# Patient Record
Sex: Female | Born: 1980 | Race: White | Hispanic: No | Marital: Single | Smoking: Former smoker
Health system: Southern US, Community
[De-identification: ages and names within clinical notes are randomized; demographics above are authoritative.]

## PROBLEM LIST (undated history)

## (undated) DIAGNOSIS — F32A Depression, unspecified: Secondary | ICD-10-CM

## (undated) DIAGNOSIS — F329 Major depressive disorder, single episode, unspecified: Secondary | ICD-10-CM

## (undated) HISTORY — DX: Major depressive disorder, single episode, unspecified: F32.9

## (undated) HISTORY — DX: Depression, unspecified: F32.A

## (undated) HISTORY — PX: WISDOM TOOTH EXTRACTION: SHX21

---

## 2012-10-27 ENCOUNTER — Ambulatory Visit: Payer: Self-pay | Admitting: Internal Medicine

## 2013-09-08 ENCOUNTER — Encounter (INDEPENDENT_AMBULATORY_CARE_PROVIDER_SITE_OTHER): Payer: Self-pay

## 2013-09-08 ENCOUNTER — Encounter: Payer: Self-pay | Admitting: Internal Medicine

## 2013-09-08 ENCOUNTER — Ambulatory Visit (INDEPENDENT_AMBULATORY_CARE_PROVIDER_SITE_OTHER): Payer: BC Managed Care – PPO | Admitting: Internal Medicine

## 2013-09-08 VITALS — BP 106/74 | HR 68 | Temp 98.5°F | Ht 65.5 in | Wt 160.0 lb

## 2013-09-08 DIAGNOSIS — M542 Cervicalgia: Secondary | ICD-10-CM

## 2013-09-08 NOTE — Progress Notes (Signed)
Subjective:    Patient ID: Kristina Hancock, female    DOB: 21-May-1980, 33 y.o.   MRN: [id number]  HPI  Pt presents to the clinic today with c/o soreness in the front of her neck. This started 4 days shortly after she was cleaning a room at work. She also noticed a sharp pain in the back of her neck radiating to the base of her skull. The sharp pain has resolved but she now reports a soreness in the area. She denies ear pain, runny nose, sore throat, difficulty swallowing. She denies any particular injury to the area. She has not tried anything OTC for this. She reports that her symptoms are improved today but she is just concerned about what made her feel this way.   Review of Systems      No past medical history on file.  Current Outpatient Prescriptions  Medication Sig Dispense Refill  . escitalopram (LEXAPRO) 20 MG tablet Take 20 mg by mouth daily.       No current facility-administered medications for this visit.    Allergies  Allergen Reactions  . Hydrocodone Itching    No family history on file.  History   Social History  . Marital Status: Single    Spouse Name: N/A    Number of Children: N/A  . Years of Education: N/A   Occupational History  . Not on file.   Social History Main Topics  . Smoking status: Former Games developer  . Smokeless tobacco: Not on file     Comment: quit 12 years ago  . Alcohol Use: Yes     Comment: rare  . Drug Use: No  . Sexual Activity: Not on file   Other Topics Concern  . Not on file   Social History Narrative  . No narrative on file     Constitutional: Denies fever, malaise, fatigue, headache or abrupt weight changes.  HEENT: Denies eye pain, eye redness, ear pain, ringing in the ears, wax buildup, runny nose, nasal congestion, bloody nose, or sore throat. Respiratory: Denies difficulty breathing, shortness of breath, cough or sputum production.   Cardiovascular: Denies chest pain, chest tightness, palpitations or swelling in the  hands or feet.  Musculoskeletal: Pt reports neck pain. Denies decrease in range of motion, difficulty with gait, or joint pain and swelling.  Skin: Denies redness, rashes, lesions or ulcercations.    No other specific complaints in a complete review of systems (except as listed in HPI above).  Objective:   Physical Exam   BP 106/74  Pulse 68  Temp(Src) 98.5 F (36.9 C) (Oral)  Ht 5' 5.5" (1.664 m)  Wt 160 lb (72.576 kg)  BMI 26.21 kg/m2  SpO2 98%  LMP 08/28/2013 Wt Readings from Last 3 Encounters:  09/08/13 160 lb (72.576 kg)    General: Appears her stated age, well developed, well nourished in NAD. Skin: Warm, dry and intact. No rashes, lesions or ulcerations noted. HEENT: Head: normal shape and size; Eyes: sclera white, no icterus, conjunctiva pink, PERRLA and EOMs intact; Ears: Tm's gray and intact, normal light reflex; Nose: mucosa pink and moist, septum midline; Throat/Mouth: Teeth present, mucosa pink and moist, no exudate, lesions or ulcerations noted.  Neck: Normal range of motion. Neck supple, trachea midline. No masses, lumps or thyromegaly present.  Cardiovascular: Normal rate and rhythm. S1,S2 noted.  No murmur, rubs or gallops noted. No JVD or BLE edema. No carotid bruits noted. Pulmonary/Chest: Normal effort and positive vesicular breath sounds. No respiratory distress.  No wheezes, rales or ronchi noted.        Assessment & Plan:   Neck soreness:  She would like to have her thyroid checked Will check CBC as well Exam normal today Advised her to try some Ibuprofen in case of possible inflammation  Will follow up with you after labs are back

## 2013-09-08 NOTE — Progress Notes (Signed)
Pre visit review using our clinic review tool, if applicable. No additional management support is needed unless otherwise documented below in the visit note. 

## 2013-09-09 LAB — CBC
HEMATOCRIT: 41.7 % (ref 36.0–46.0)
Hemoglobin: 14.1 g/dL (ref 12.0–15.0)
MCHC: 33.9 g/dL (ref 30.0–36.0)
MCV: 88.7 fl (ref 78.0–100.0)
PLATELETS: 303 10*3/uL (ref 150.0–400.0)
RBC: 4.7 Mil/uL (ref 3.87–5.11)
RDW: 12.9 % (ref 11.5–15.5)
WBC: 7 10*3/uL (ref 4.0–10.5)

## 2013-09-09 LAB — TSH: TSH: 0.8 u[IU]/mL (ref 0.35–4.50)

## 2013-09-09 LAB — T4, FREE: Free T4: 1 ng/dL (ref 0.60–1.60)

## 2013-10-07 ENCOUNTER — Ambulatory Visit: Payer: Self-pay | Admitting: Family Medicine

## 2013-11-25 ENCOUNTER — Encounter: Payer: Self-pay | Admitting: Internal Medicine

## 2013-11-25 ENCOUNTER — Ambulatory Visit (INDEPENDENT_AMBULATORY_CARE_PROVIDER_SITE_OTHER): Payer: BC Managed Care – PPO | Admitting: Internal Medicine

## 2013-11-25 VITALS — BP 104/78 | HR 67 | Temp 98.9°F | Ht 65.5 in | Wt 163.0 lb

## 2013-11-25 DIAGNOSIS — Z13220 Encounter for screening for lipoid disorders: Secondary | ICD-10-CM

## 2013-11-25 DIAGNOSIS — F32A Depression, unspecified: Secondary | ICD-10-CM | POA: Insufficient documentation

## 2013-11-25 DIAGNOSIS — F329 Major depressive disorder, single episode, unspecified: Secondary | ICD-10-CM

## 2013-11-25 DIAGNOSIS — L709 Acne, unspecified: Secondary | ICD-10-CM | POA: Insufficient documentation

## 2013-11-25 MED ORDER — CLINDAMYCIN PHOS-BENZOYL PEROX 1-5 % EX GEL: Freq: Two times a day (BID) | CUTANEOUS | Status: DC

## 2013-11-25 NOTE — Progress Notes (Signed)
Pre visit review using our clinic review tool, if applicable. No additional management support is needed unless otherwise documented below in the visit note. 

## 2013-11-25 NOTE — Patient Instructions (Signed)

## 2013-11-25 NOTE — Assessment & Plan Note (Signed)
Well controlled on lexapro Will check CMET today

## 2013-11-25 NOTE — Progress Notes (Signed)
HPI  Pt presents to the clinic today to establish care. She has not had a PCP in a few years. She does have some concerns today about acne or her fast. She has tried OTC cleanses without relief. She would like a topical prescription to apply.  Anxiety: Controlled on Lexapro.  Flu: never Tetanus: 2014 LMP: 10/26/13 Pap Smear: 11/2012 Dentist: biannually  Past Medical History  Diagnosis Date  . Depression     Current Outpatient Prescriptions  Medication Sig Dispense Refill  . amphetamine-dextroamphetamine (ADDERALL) 20 MG tablet as needed.   0  . escitalopram (LEXAPRO) 20 MG tablet Take 20 mg by mouth daily.     No current facility-administered medications for this visit.    Allergies  Allergen Reactions  . Hydrocodone Itching    Family History  Problem Relation Age of Onset  . Hypertension Brother   . Stroke Maternal Uncle   . Diabetes Maternal Uncle   . Cancer Maternal Grandmother     ovarin  . Heart disease Paternal Grandfather     History   Social History  . Marital Status: Single    Spouse Name: N/A    Number of Children: N/A  . Years of Education: N/A   Occupational History  . Not on file.   Social History Main Topics  . Smoking status: Former Games developermoker  . Smokeless tobacco: Not on file     Comment: quit 12 years ago  . Alcohol Use: Yes     Comment: rare  . Drug Use: No  . Sexual Activity: No   Other Topics Concern  . Not on file   Social History Narrative    ROS:  Constitutional: Denies fever, malaise, fatigue, headache or abrupt weight changes.  Respiratory: Denies difficulty breathing, shortness of breath, cough or sputum production.   Cardiovascular: Denies chest pain, chest tightness, palpitations or swelling in the hands or feet.  Skin: Denies lesions, ulceration or rash. Psych: Pt reports anxiety/depression. Denies SI/HI.  No other specific complaints in a complete review of systems (except as listed in HPI above).  PE:  BP 104/78  mmHg  Pulse 67  Temp(Src) 98.9 F (37.2 C) (Oral)  Ht 5' 5.5" (1.664 m)  Wt 163 lb (73.936 kg)  BMI 26.70 kg/m2  SpO2 99%  LMP 10/26/2013 Wt Readings from Last 3 Encounters:  11/25/13 163 lb (73.936 kg)  09/08/13 160 lb (72.576 kg)    General: Appears her stated age, well developed, well nourished in NAD. Skin: Cystic acne noted on chin. Skin otherwise, dry and intact. Cardiovascular: Normal rate and rhythm. S1,S2 noted.  No murmur, rubs or gallops noted. Pulmonary/Chest: Normal effort and positive vesicular breath sounds. No respiratory distress. No wheezes, rales or ronchi noted.  Psychiatric: Mood and affect normal. Behavior is normal. Judgment and thought content normal.    CBC    Component Value Date/Time   WBC 7.0 09/08/2013 1633   RBC 4.70 09/08/2013 1633   HGB 14.1 09/08/2013 1633   HCT 41.7 09/08/2013 1633   PLT 303.0 09/08/2013 1633   MCV 88.7 09/08/2013 1633   MCHC 33.9 09/08/2013 1633   RDW 12.9 09/08/2013 1633     Assessment and Plan: Acne:  RX for benzaclin given  Screening for hyperlipidemia:  Will check Lipid profile today  RTC in 1 year for annual physical

## 2013-11-26 LAB — LIPID PANEL
CHOLESTEROL: 175 mg/dL (ref 0–200)
HDL: 63 mg/dL (ref >39)
LDL Cholesterol: 97 mg/dL (ref 0–99)
TRIGLYCERIDES: 73 mg/dL (ref <150)
Total CHOL/HDL Ratio: 2.8 Ratio
VLDL: 15 mg/dL (ref 0–40)

## 2013-11-26 LAB — COMPREHENSIVE METABOLIC PANEL
ALK PHOS: 74 U/L (ref 39–117)
ALT: 16 U/L (ref 0–35)
AST: 9 U/L (ref 0–37)
Albumin: 4.1 g/dL (ref 3.5–5.2)
BILIRUBIN TOTAL: 0.5 mg/dL (ref 0.2–1.2)
BUN: 7 mg/dL (ref 6–23)
CO2: 26 meq/L (ref 19–32)
CREATININE: 0.68 mg/dL (ref 0.50–1.10)
Calcium: 9.1 mg/dL (ref 8.4–10.5)
Chloride: 104 mEq/L (ref 96–112)
GLUCOSE: 66 mg/dL — AB (ref 70–99)
Potassium: 4.3 mEq/L (ref 3.5–5.3)
SODIUM: 140 meq/L (ref 135–145)
TOTAL PROTEIN: 6.6 g/dL (ref 6.0–8.3)

## 2014-02-08 ENCOUNTER — Encounter: Payer: Self-pay | Admitting: Internal Medicine

## 2014-02-24 ENCOUNTER — Encounter: Payer: Self-pay | Admitting: Internal Medicine

## 2014-02-24 MED ORDER — ESCITALOPRAM OXALATE 20 MG PO TABS: 20.0000 mg | ORAL_TABLET | Freq: Every day | ORAL | Status: DC

## 2014-02-27 ENCOUNTER — Ambulatory Visit (INDEPENDENT_AMBULATORY_CARE_PROVIDER_SITE_OTHER): Payer: BLUE CROSS/BLUE SHIELD | Admitting: Internal Medicine

## 2014-02-27 ENCOUNTER — Encounter: Payer: Self-pay | Admitting: Internal Medicine

## 2014-02-27 VITALS — BP 112/74 | HR 67 | Temp 98.1°F | Wt 172.0 lb

## 2014-02-27 DIAGNOSIS — L200 Besnier's prurigo: Secondary | ICD-10-CM

## 2014-02-27 DIAGNOSIS — L239 Allergic contact dermatitis, unspecified cause: Secondary | ICD-10-CM

## 2014-02-27 MED ORDER — PREDNISONE 10 MG PO TABS: ORAL_TABLET | ORAL | Status: DC

## 2014-02-27 NOTE — Progress Notes (Signed)
Subjective:    Patient ID: Kristina Hancock, female    DOB: March 17, 1980, 34 y.o.   MRN: [id number]  HPI  Pt presents to the clinic today with c/o a rash on her neck. She reports this started 2 days ago. The rash is itchy and sore. It does seem to be spreading around the sides of her neck. She has has not used any new soaps, lotions, or detergents. She has used Calamine lotion OTC without any relief. She reports she has not changed her diet or started any new medications. She has never had a rash like this in the past.  Review of Systems      Past Medical History  Diagnosis Date  . Depression     Current Outpatient Prescriptions  Medication Sig Dispense Refill  . amphetamine-dextroamphetamine (ADDERALL) 20 MG tablet as needed.   0  . clindamycin-benzoyl peroxide (BENZACLIN) gel Apply topically 2 (two) times daily. 25 g 0  . escitalopram (LEXAPRO) 20 MG tablet Take 1 tablet (20 mg total) by mouth daily. 30 tablet 4   No current facility-administered medications for this visit.    Allergies  Allergen Reactions  . Hydrocodone Itching    Family History  Problem Relation Age of Onset  . Hypertension Brother   . Stroke Maternal Uncle   . Diabetes Maternal Uncle   . Cancer Maternal Grandmother     ovarin  . Heart disease Paternal Grandfather     History   Social History  . Marital Status: Single    Spouse Name: N/A    Number of Children: N/A  . Years of Education: N/A   Occupational History  . Not on file.   Social History Main Topics  . Smoking status: Former Games developer  . Smokeless tobacco: Not on file     Comment: quit 12 years ago  . Alcohol Use: Yes     Comment: rare  . Drug Use: No  . Sexual Activity: No   Other Topics Concern  . Not on file   Social History Narrative     Constitutional: Denies fever, malaise, fatigue, headache or abrupt weight changes.  Respiratory: Denies difficulty breathing, shortness of breath, cough or sputum production.     Cardiovascular: Denies chest pain, chest tightness, palpitations or swelling in the hands or feet.  Skin: Pt reports rash. Denies redness, lesions or ulcercations.  Neurological: Denies dizziness, difficulty with memory, difficulty with speech or problems with balance and coordination.   No other specific complaints in a complete review of systems (except as listed in HPI above).  Objective:   Physical Exam   BP 112/74 mmHg  Pulse 67  Temp(Src) 98.1 F (36.7 C) (Oral)  Wt 172 lb (78.019 kg)  SpO2 98% Wt Readings from Last 3 Encounters:  02/27/14 172 lb (78.019 kg)  11/25/13 163 lb (73.936 kg)  09/08/13 160 lb (72.576 kg)    General: Appears her stated age, well developed, well nourished in NAD. Skin: Warm, dry and intact. Maculopapular rash noted on neck only. Cardiovascular: Normal rate and rhythm. S1,S2 noted.  No murmur, rubs or gallops noted.  Pulmonary/Chest: Normal effort and positive vesicular breath sounds. No respiratory distress. No wheezes, rales or ronchi noted.  Neurological: Alert and oriented.   BMET    Component Value Date/Time   NA 140 11/25/2013 1552   K 4.3 11/25/2013 1552   CL 104 11/25/2013 1552   CO2 26 11/25/2013 1552   GLUCOSE 66* 11/25/2013 1552   BUN 7 11/25/2013  1552   CREATININE 0.68 11/25/2013 1552   CALCIUM 9.1 11/25/2013 1552    Lipid Panel     Component Value Date/Time   CHOL 175 11/25/2013 1552   TRIG 73 11/25/2013 1552   HDL 63 11/25/2013 1552   CHOLHDL 2.8 11/25/2013 1552   VLDL 15 11/25/2013 1552   LDLCALC 97 11/25/2013 1552    CBC    Component Value Date/Time   WBC 7.0 09/08/2013 1633   RBC 4.70 09/08/2013 1633   HGB 14.1 09/08/2013 1633   HCT 41.7 09/08/2013 1633   PLT 303.0 09/08/2013 1633   MCV 88.7 09/08/2013 1633   MCHC 33.9 09/08/2013 1633   RDW 12.9 09/08/2013 1633    Hgb A1C No results found for: HGBA1C      Assessment & Plan:   Allergic Dermatitis noted on neck:  The area is too large to be  treated with steroid cream eRx for pred taper x 6 days Try to avoid touching the neck if you can  RTC as needed or if symptoms persist or worsen

## 2014-02-27 NOTE — Progress Notes (Signed)
Pre visit review using our clinic review tool, if applicable. No additional management support is needed unless otherwise documented below in the visit note. 

## 2014-02-27 NOTE — Patient Instructions (Signed)

## 2014-03-02 ENCOUNTER — Encounter: Payer: Self-pay | Admitting: Internal Medicine

## 2014-03-06 NOTE — Telephone Encounter (Signed)
Work note has been faxed per pt request

## 2014-03-14 ENCOUNTER — Encounter: Payer: Self-pay | Admitting: Internal Medicine

## 2014-03-15 MED ORDER — AMPHETAMINE-DEXTROAMPHETAMINE 20 MG PO TABS: 20.0000 mg | ORAL_TABLET | Freq: Two times a day (BID) | ORAL | Status: DC

## 2014-03-15 NOTE — Telephone Encounter (Signed)
RX printed and signed, placed in MYD's box

## 2014-03-16 NOTE — Telephone Encounter (Signed)
Rx left in front office for pick up and pt is aware  

## 2014-05-01 ENCOUNTER — Telehealth: Payer: Self-pay | Admitting: Internal Medicine

## 2014-05-02 MED ORDER — AMPHETAMINE-DEXTROAMPHETAMINE 20 MG PO TABS: 20.0000 mg | ORAL_TABLET | Freq: Two times a day (BID) | ORAL | Status: DC

## 2014-05-02 NOTE — Telephone Encounter (Signed)
Adderall RX printed and signed and placed in [facility] box

## 2014-07-27 ENCOUNTER — Other Ambulatory Visit: Payer: Self-pay | Admitting: Internal Medicine

## 2014-07-27 MED ORDER — AMPHETAMINE-DEXTROAMPHETAMINE 20 MG PO TABS: 20.0000 mg | ORAL_TABLET | Freq: Two times a day (BID) | ORAL | Status: DC

## 2014-07-27 MED ORDER — ESCITALOPRAM OXALATE 20 MG PO TABS: 20.0000 mg | ORAL_TABLET | Freq: Every day | ORAL | Status: DC

## 2014-07-27 NOTE — Telephone Encounter (Signed)
Last filled 05/02/2014 Adderall---Celexa last filled 02/24/14 with 4 refills--pt request 90 day supply--please advise

## 2014-07-27 NOTE — Telephone Encounter (Signed)
mychart msg sent to pt 

## 2014-07-27 NOTE — Telephone Encounter (Signed)
lexapro sent electronically. Adderall RX printed and signed and placed in MYD box

## 2014-09-07 ENCOUNTER — Ambulatory Visit (INDEPENDENT_AMBULATORY_CARE_PROVIDER_SITE_OTHER): Payer: BLUE CROSS/BLUE SHIELD | Admitting: Internal Medicine

## 2014-09-07 ENCOUNTER — Encounter: Payer: Self-pay | Admitting: Internal Medicine

## 2014-09-07 VITALS — BP 110/70 | HR 92 | Temp 97.7°F | Wt 161.0 lb

## 2014-09-07 DIAGNOSIS — K1120 Sialoadenitis, unspecified: Secondary | ICD-10-CM

## 2014-09-07 MED ORDER — AMOXICILLIN 500 MG PO TABS: 1000.0000 mg | ORAL_TABLET | Freq: Two times a day (BID) | ORAL | Status: DC

## 2014-09-07 MED ORDER — NEOMYCIN-POLYMYXIN-HC 3.5-10000-1 OT SUSP: 3.0000 [drp] | Freq: Four times a day (QID) | OTIC | Status: DC

## 2014-09-07 NOTE — Progress Notes (Signed)
   Subjective:    Patient ID: Kristina Hancock, female    DOB: [birth year], 34 y.o.   MRN: [id number]  HPI Here due to ear pain  Having soreness in left ear starting last night Progressively worsening Towards the outside ?nodes swollen  2 days ago--she was stuffy and eye itching Thought it was allergy Didn't persist (but doesn't feel great in the past few hours) No fever  No discharge No Rx No recent swimming  Current Outpatient Prescriptions on File Prior to Visit  Medication Sig Dispense Refill  . amphetamine-dextroamphetamine (ADDERALL) 20 MG tablet Take 1 tablet (20 mg total) by mouth 2 (two) times daily. 60 tablet 0  . escitalopram (LEXAPRO) 20 MG tablet Take 1 tablet (20 mg total) by mouth daily. 90 tablet 1   No current facility-administered medications on file prior to visit.    Allergies  Allergen Reactions  . Hydrocodone Itching    Past Medical History  Diagnosis Date  . Depression     Past Surgical History  Procedure Laterality Date  . Wisdom tooth extraction      Family History  Problem Relation Age of Onset  . Hypertension Brother   . Stroke Maternal Uncle   . Diabetes Maternal Uncle   . Cancer Maternal Grandmother     ovarin  . Heart disease Paternal Grandfather     Social History   Social History  . Marital Status: Single    Spouse Name: N/A  . Number of Children: N/A  . Years of Education: N/A   Occupational History  . Not on file.   Social History Main Topics  . Smoking status: Former Games developer  . Smokeless tobacco: Not on file     Comment: quit 12 years ago  . Alcohol Use: Yes     Comment: rare  . Drug Use: No  . Sexual Activity: No   Other Topics Concern  . Not on file   Social History Narrative   Review of Systems  No swallowing problems Appetite is fine     Objective:   Physical Exam  Constitutional: She appears well-developed and well-nourished. No distress.  HENT:  Mouth/Throat: Oropharynx is clear and moist. No  oropharyngeal exudate.  Slight enlargement and tenderness of right parotid. Some tragal tenderness on right but exam of ear is normal Left side normal No sinus tenderness          Assessment & Plan:

## 2014-09-07 NOTE — Progress Notes (Signed)
Pre visit review using our clinic review tool, if applicable. No additional management support is needed unless otherwise documented below in the visit note. 

## 2014-09-07 NOTE — Assessment & Plan Note (Signed)
I think this is causing her ear pain--since ear looks okay Will rx with amoxil Will give Rx for cortisporin just in case (going on cruise tomorrow)

## 2014-10-18 ENCOUNTER — Telehealth: Payer: Self-pay | Admitting: Family

## 2014-10-18 DIAGNOSIS — B358 Other dermatophytoses: Secondary | ICD-10-CM

## 2014-10-18 MED ORDER — CLOTRIMAZOLE-BETAMETHASONE 1-0.05 % EX CREA: 1.0000 "application " | TOPICAL_CREAM | Freq: Two times a day (BID) | CUTANEOUS | Status: DC

## 2014-10-18 NOTE — Progress Notes (Signed)
We are sorry that you are not feeling well.  Here is how we plan to help!  Overview:  Based on our telephone conversation, the patchy/itchy area beside your right eyelid could be viral or fungal given the history you shared with me on the phone about a recent fungal skin patch/infection for which you were treated. That being said, we can take a 2-pronged approach as I mentioned on the telephone to include a cream that has an anti-fungal and a mild steroid in the same tube. It is unlikely to be contact dermatitis based on your denial of any recent changes in soaps, face wash, lotions, or makeup.   Another specific option (hard to tell since your image would not upload to our system), is that it is a fungal infection commonly known as Facial Ringworm. It nearly fits the description for that. I will paste that information below as well to help you. The treatment is the same, regardless.  Plan:  Clotrimazole-betamethasone cream 1 application twice to affected areas, applied 2cm beyond the border of the patchy areas.   Details:  Facial ringworm may occur in people of all ages, of all races, and of both sexes. However, it is more common in warmer, more humid climates. In addition, it is most frequently seen in adults aged 20-40.  People with suppressed immune systems (eg, with diabetes, leukemia, or HIV/AIDS) are more likely to develop facial ringworm or to have more severe forms of the disease.  Signs and Symptoms The most common locations for facial ringworm include the following: Cheeks Nose Around the eye Chin Forehead  Facial ringworm appears as one or more pink-to-red scaly patches ranging in size from 1 to 5 cm. The border of the affected skin may be raised and may contain bumps, blisters, or scabs. Often, the center of the lesion has normal-appearing skin with a ring-shaped edge, leading to the nickname "ringworm," even though it is not caused by a worm.  Facial ringworm can be itchy, and  it may get worse or feel sunburned after exposure to the sun.  Self-Care Guidelines:  Apply the cream to each lesion and to the normal-appearing skin 2 cm beyond the border of the affected skin for at least 2 weeks until the lesions are completely gone. Because ringworm is very contagious, avoid contact sports until lesions have been treated for at least 48 hours.  Since people often have tinea infections on more than one body part, examine yourself for other ringworm infections, such as in the groin (tinea cruris), on the feet (tinea pedis, athlete's foot), and anywhere else on the body (tinea corporis).  Have any household pets evaluated by a veterinarian to make sure that they do not have a fungal (ie, dermatophyte) infection. If the veterinarian discovers an infection, be sure to have the animal treated.  Your e-visit answers were reviewed by a board certified advanced clinical practitioner to complete your personal care plan.  Depending on the condition, your plan could have included both over the counter or prescription medications.  If there is a problem please reply  once you have received a response from your provider.  Your safety is important to [place].  If you have drug allergies check your prescription carefully.    You can use MyChart to ask questions about today's visit, request a non-urgent call back, or ask for a work or school excuse for 24 hours related to this e-Visit. If it has been greater than 24 hours you will need  to follow up with your provider, or enter a new e-Visit to address those concerns.   You will get an e-mail in the next two days asking about your experience.  I hope that your e-visit has been valuable and will speed your recovery. Thank you for using e-visits.

## 2014-11-03 ENCOUNTER — Encounter: Payer: Self-pay | Admitting: Internal Medicine

## 2015-02-23 ENCOUNTER — Ambulatory Visit (INDEPENDENT_AMBULATORY_CARE_PROVIDER_SITE_OTHER): Payer: BLUE CROSS/BLUE SHIELD | Admitting: Internal Medicine

## 2015-02-23 ENCOUNTER — Encounter: Payer: Self-pay | Admitting: Internal Medicine

## 2015-02-23 ENCOUNTER — Other Ambulatory Visit (HOSPITAL_COMMUNITY)
Admission: RE | Admit: 2015-02-23 | Discharge: 2015-02-23 | Disposition: A | Discharge status: Home / Self Care | Payer: BLUE CROSS/BLUE SHIELD | Source: Ambulatory Visit | Attending: Internal Medicine | Admitting: Internal Medicine

## 2015-02-23 VITALS — BP 118/78 | HR 73 | Temp 98.2°F | Ht 63.5 in | Wt 174.0 lb

## 2015-02-23 DIAGNOSIS — Z1151 Encounter for screening for human papillomavirus (HPV): Secondary | ICD-10-CM | POA: Insufficient documentation

## 2015-02-23 DIAGNOSIS — J0100 Acute maxillary sinusitis, unspecified: Secondary | ICD-10-CM | POA: Diagnosis not present

## 2015-02-23 DIAGNOSIS — Z124 Encounter for screening for malignant neoplasm of cervix: Secondary | ICD-10-CM

## 2015-02-23 DIAGNOSIS — Z01419 Encounter for gynecological examination (general) (routine) without abnormal findings: Secondary | ICD-10-CM | POA: Insufficient documentation

## 2015-02-23 DIAGNOSIS — Z0001 Encounter for general adult medical examination with abnormal findings: Secondary | ICD-10-CM | POA: Diagnosis not present

## 2015-02-23 DIAGNOSIS — Z0000 Encounter for general adult medical examination without abnormal findings: Secondary | ICD-10-CM

## 2015-02-23 DIAGNOSIS — F329 Major depressive disorder, single episode, unspecified: Secondary | ICD-10-CM

## 2015-02-23 DIAGNOSIS — F32A Depression, unspecified: Secondary | ICD-10-CM

## 2015-02-23 LAB — LIPID PANEL
CHOL/HDL RATIO: 3
CHOLESTEROL: 197 mg/dL (ref 0–200)
HDL: 60.9 mg/dL (ref >39.00)
LDL Cholesterol: 100 mg/dL — ABNORMAL HIGH (ref 0–99)
NonHDL: 136.16
Triglycerides: 183 mg/dL — ABNORMAL HIGH (ref 0.0–149.0)
VLDL: 36.6 mg/dL (ref 0.0–40.0)

## 2015-02-23 LAB — COMPREHENSIVE METABOLIC PANEL WITH GFR
ALT: 19 U/L (ref 0–35)
AST: 10 U/L (ref 0–37)
Albumin: 4.4 g/dL (ref 3.5–5.2)
Alkaline Phosphatase: 76 U/L (ref 39–117)
BUN: 12 mg/dL (ref 6–23)
CO2: 32 meq/L (ref 19–32)
Calcium: 9.7 mg/dL (ref 8.4–10.5)
Chloride: 101 meq/L (ref 96–112)
Creatinine, Ser: 0.73 mg/dL (ref 0.40–1.20)
GFR: 96.39 mL/min
Glucose, Bld: 86 mg/dL (ref 70–99)
Potassium: 4 meq/L (ref 3.5–5.1)
Sodium: 139 meq/L (ref 135–145)
Total Bilirubin: 0.3 mg/dL (ref 0.2–1.2)
Total Protein: 7.3 g/dL (ref 6.0–8.3)

## 2015-02-23 LAB — CBC
HCT: 44.1 % (ref 36.0–46.0)
Hemoglobin: 14.9 g/dL (ref 12.0–15.0)
MCHC: 33.7 g/dL (ref 30.0–36.0)
MCV: 86.9 fl (ref 78.0–100.0)
Platelets: 310 10*3/uL (ref 150.0–400.0)
RBC: 5.08 Mil/uL (ref 3.87–5.11)
RDW: 12.9 % (ref 11.5–15.5)
WBC: 6.1 10*3/uL (ref 4.0–10.5)

## 2015-02-23 LAB — TSH: TSH: 1.18 u[IU]/mL (ref 0.35–4.50)

## 2015-02-23 LAB — HEMOGLOBIN A1C: Hgb A1c MFr Bld: 5.2 % (ref 4.6–6.5)

## 2015-02-23 MED ORDER — AMOXICILLIN 500 MG PO TABS: 500.0000 mg | ORAL_TABLET | Freq: Three times a day (TID) | ORAL | Status: DC | PRN

## 2015-02-23 MED ORDER — ESCITALOPRAM OXALATE 20 MG PO TABS: 20.0000 mg | ORAL_TABLET | Freq: Every day | ORAL | Status: DC

## 2015-02-23 NOTE — Progress Notes (Signed)
Pre visit review using our clinic review tool, if applicable. No additional management support is needed unless otherwise documented below in the visit note. 

## 2015-02-23 NOTE — Patient Instructions (Signed)
Health Maintenance, Female Adopting a healthy lifestyle and getting preventive care can go a long way to promote health and wellness. Talk with your health care provider about what schedule of regular examinations is right for you. This is a good chance for you to check in with your provider about disease prevention and staying healthy. In between checkups, there are plenty of things you can do on your own. Experts have done a lot of research about which lifestyle changes and preventive measures are most likely to keep you healthy. Ask your health care provider for more information. WEIGHT AND DIET  Eat a healthy diet  Be sure to include plenty of vegetables, fruits, low-fat dairy products, and lean protein.  Do not eat a lot of foods high in solid fats, added sugars, or salt.  Get regular exercise. This is one of the most important things you can do for your health.  Most adults should exercise for at least 150 minutes each week. The exercise should increase your heart rate and make you sweat (moderate-intensity exercise).  Most adults should also do strengthening exercises at least twice a week. This is in addition to the moderate-intensity exercise.  Maintain a healthy weight  Body mass index (BMI) is a measurement that can be used to identify possible weight problems. It estimates body fat based on height and weight. Your health care provider can help determine your BMI and help you achieve or maintain a healthy weight.  For females 20 years of age and older:   A BMI below 18.5 is considered underweight.  A BMI of 18.5 to 24.9 is normal.  A BMI of 25 to 29.9 is considered overweight.  A BMI of 30 and above is considered obese.  Watch levels of cholesterol and blood lipids  You should start having your blood tested for lipids and cholesterol at 35 years of age, then have this test every 5 years.  You may need to have your cholesterol levels checked more often if:  Your lipid  or cholesterol levels are high.  You are older than 35 years of age.  You are at high risk for heart disease.  CANCER SCREENING   Lung Cancer  Lung cancer screening is recommended for adults 55-80 years old who are at high risk for lung cancer because of a history of smoking.  A yearly low-dose CT scan of the lungs is recommended for people who:  Currently smoke.  Have quit within the past 15 years.  Have at least a 30-pack-year history of smoking. A pack year is smoking an average of one pack of cigarettes a day for 1 year.  Yearly screening should continue until it has been 15 years since you quit.  Yearly screening should stop if you develop a health problem that would prevent you from having lung cancer treatment.  Breast Cancer  Practice breast self-awareness. This means understanding how your breasts normally appear and feel.  It also means doing regular breast self-exams. Let your health care provider know about any changes, no matter how small.  If you are in your 20s or 30s, you should have a clinical breast exam (CBE) by a health care provider every 1-3 years as part of a regular health exam.  If you are 40 or older, have a CBE every year. Also consider having a breast X-ray (mammogram) every year.  If you have a family history of breast cancer, talk to your health care provider about genetic screening.  If you   are at high risk for breast cancer, talk to your health care provider about having an MRI and a mammogram every year.  Breast cancer gene (BRCA) assessment is recommended for women who have family members with BRCA-related cancers. BRCA-related cancers include:  Breast.  Ovarian.  Tubal.  Peritoneal cancers.  Results of the assessment will determine the need for genetic counseling and BRCA1 and BRCA2 testing. Cervical Cancer Your health care provider may recommend that you be screened regularly for cancer of the pelvic organs (ovaries, uterus, and  vagina). This screening involves a pelvic examination, including checking for microscopic changes to the surface of your cervix (Pap test). You may be encouraged to have this screening done every 3 years, beginning at age 21.  For women ages 30-65, health care providers may recommend pelvic exams and Pap testing every 3 years, or they may recommend the Pap and pelvic exam, combined with testing for human papilloma virus (HPV), every 5 years. Some types of HPV increase your risk of cervical cancer. Testing for HPV may also be done on women of any age with unclear Pap test results.  Other health care providers may not recommend any screening for nonpregnant women who are considered low risk for pelvic cancer and who do not have symptoms. Ask your health care provider if a screening pelvic exam is right for you.  If you have had past treatment for cervical cancer or a condition that could lead to cancer, you need Pap tests and screening for cancer for at least 20 years after your treatment. If Pap tests have been discontinued, your risk factors (such as having a new sexual partner) need to be reassessed to determine if screening should resume. Some women have medical problems that increase the chance of getting cervical cancer. In these cases, your health care provider may recommend more frequent screening and Pap tests. Colorectal Cancer  This type of cancer can be detected and often prevented.  Routine colorectal cancer screening usually begins at 35 years of age and continues through 35 years of age.  Your health care provider may recommend screening at an earlier age if you have risk factors for colon cancer.  Your health care provider may also recommend using home test kits to check for hidden blood in the stool.  A small camera at the end of a tube can be used to examine your colon directly (sigmoidoscopy or colonoscopy). This is done to check for the earliest forms of colorectal  cancer.  Routine screening usually begins at age 50.  Direct examination of the colon should be repeated every 5-10 years through 35 years of age. However, you may need to be screened more often if early forms of precancerous polyps or small growths are found. Skin Cancer  Check your skin from head to toe regularly.  Tell your health care provider about any new moles or changes in moles, especially if there is a change in a mole's shape or color.  Also tell your health care provider if you have a mole that is larger than the size of a pencil eraser.  Always use sunscreen. Apply sunscreen liberally and repeatedly throughout the day.  Protect yourself by wearing long sleeves, pants, a wide-brimmed hat, and sunglasses whenever you are outside. HEART DISEASE, DIABETES, AND HIGH BLOOD PRESSURE   High blood pressure causes heart disease and increases the risk of stroke. High blood pressure is more likely to develop in:  People who have blood pressure in the high end   of the normal range (130-139/85-89 mm Hg).  People who are overweight or obese.  People who are African American.  If you are 38-23 years of age, have your blood pressure checked every 3-5 years. If you are 61 years of age or older, have your blood pressure checked every year. You should have your blood pressure measured twice--once when you are at a hospital or clinic, and once when you are not at a hospital or clinic. Record the average of the two measurements. To check your blood pressure when you are not at a hospital or clinic, you can use:  An automated blood pressure machine at a pharmacy.  A home blood pressure monitor.  If you are between 45 years and 39 years old, ask your health care provider if you should take aspirin to prevent strokes.  Have regular diabetes screenings. This involves taking a blood sample to check your fasting blood sugar level.  If you are at a normal weight and have a low risk for diabetes,  have this test once every three years after 35 years of age.  If you are overweight and have a high risk for diabetes, consider being tested at a younger age or more often. PREVENTING INFECTION  Hepatitis B  If you have a higher risk for hepatitis B, you should be screened for this virus. You are considered at high risk for hepatitis B if:  You were born in a country where hepatitis B is common. Ask your health care provider which countries are considered high risk.  Your parents were born in a high-risk country, and you have not been immunized against hepatitis B (hepatitis B vaccine).  You have HIV or AIDS.  You use needles to inject street drugs.  You live with someone who has hepatitis B.  You have had sex with someone who has hepatitis B.  You get hemodialysis treatment.  You take certain medicines for conditions, including cancer, organ transplantation, and autoimmune conditions. Hepatitis C  Blood testing is recommended for:  Everyone born from [birth year] through 1965.  Anyone with known risk factors for hepatitis C. Sexually transmitted infections (STIs)  You should be screened for sexually transmitted infections (STIs) including gonorrhea and chlamydia if:  You are sexually active and are younger than 35 years of age.  You are older than 35 years of age and your health care provider tells you that you are at risk for this type of infection.  Your sexual activity has changed since you were last screened and you are at an increased risk for chlamydia or gonorrhea. Ask your health care provider if you are at risk.  If you do not have HIV, but are at risk, it may be recommended that you take a prescription medicine daily to prevent HIV infection. This is called pre-exposure prophylaxis (PrEP). You are considered at risk if:  You are sexually active and do not regularly use condoms or know the HIV status of your partner(s).  You take drugs by injection.  You are sexually  active with a partner who has HIV. Talk with your health care provider about whether you are at high risk of being infected with HIV. If you choose to begin PrEP, you should first be tested for HIV. You should then be tested every 3 months for as long as you are taking PrEP.  PREGNANCY   If you are premenopausal and you may become pregnant, ask your health care provider about preconception counseling.  If you may  become pregnant, take 400 to 800 micrograms (mcg) of folic acid every day.  If you want to prevent pregnancy, talk to your health care provider about birth control (contraception). OSTEOPOROSIS AND MENOPAUSE   Osteoporosis is a disease in which the bones lose minerals and strength with aging. This can result in serious bone fractures. Your risk for osteoporosis can be identified using a bone density scan.  If you are 61 years of age or older, or if you are at risk for osteoporosis and fractures, ask your health care provider if you should be screened.  Ask your health care provider whether you should take a calcium or vitamin D supplement to lower your risk for osteoporosis.  Menopause may have certain physical symptoms and risks.  Hormone replacement therapy may reduce some of these symptoms and risks. Talk to your health care provider about whether hormone replacement therapy is right for you.  HOME CARE INSTRUCTIONS   Schedule regular health, dental, and eye exams.  Stay current with your immunizations.   Do not use any tobacco products including cigarettes, chewing tobacco, or electronic cigarettes.  If you are pregnant, do not drink alcohol.  If you are breastfeeding, limit how much and how often you drink alcohol.  Limit alcohol intake to no more than 1 drink per day for nonpregnant women. One drink equals 12 ounces of beer, 5 ounces of wine, or 1 ounces of hard liquor.  Do not use street drugs.  Do not share needles.  Ask your health care provider for help if  you need support or information about quitting drugs.  Tell your health care provider if you often feel depressed.  Tell your health care provider if you have ever been abused or do not feel safe at home.   This information is not intended to replace advice given to you by your health care provider. Make sure you discuss any questions you have with your health care provider.   Document Released: 07/22/2010 Document Revised: 01/27/2014 Document Reviewed: 12/08/2012 Elsevier Interactive Patient Education [employer].

## 2015-02-23 NOTE — Progress Notes (Signed)
Subjective:    Patient ID: Kristina Hancock, female    DOB: 1980/10/22, 35 y.o.   MRN: [id number]  HPI  Pt presents to the clinic today for her annual exam.  Flu: never Tetanus: She thinks in 2014 Pap Smear: 11/2012- normal Dentist: biannually  Diet: She does eat meat. She consumes fruits and veggies daily. She consumes some fried foods. She drinks mostly water and sweet tea. Exercise: She tries to get 12000-15000 steps in each day.  Review of Systems      Past Medical History  Diagnosis Date  . Depression     Current Outpatient Prescriptions  Medication Sig Dispense Refill  . escitalopram (LEXAPRO) 20 MG tablet Take 1 tablet (20 mg total) by mouth daily. 90 tablet 1  . amphetamine-dextroamphetamine (ADDERALL) 20 MG tablet Take 1 tablet (20 mg total) by mouth 2 (two) times daily. (Patient not taking: Reported on 02/23/2015) 60 tablet 0   No current facility-administered medications for this visit.    Allergies  Allergen Reactions  . Hydrocodone Itching    Family History  Problem Relation Age of Onset  . Hypertension Brother   . Stroke Maternal Uncle   . Diabetes Maternal Uncle   . Cancer Maternal Grandmother     ovarin  . Heart disease Paternal Grandfather     Social History   Social History  . Marital Status: Single    Spouse Name: N/A  . Number of Children: N/A  . Years of Education: N/A   Occupational History  . Not on file.   Social History Main Topics  . Smoking status: Former Games developer  . Smokeless tobacco: Not on file     Comment: quit 12 years ago  . Alcohol Use: Yes     Comment: rare  . Drug Use: No  . Sexual Activity: No   Other Topics Concern  . Not on file   Social History Narrative     Constitutional: Denies fever, malaise, fatigue, headache or abrupt weight changes.  HEENT: Pt reports nasal congestion, sore throat. Denies eye pain, eye redness, ear pain, ringing in the ears, wax buildup, runny nose, bloody nose. Respiratory: Denies  difficulty breathing, shortness of breath, cough or sputum production.   Cardiovascular: Denies chest pain, chest tightness, palpitations or swelling in the hands or feet.  Gastrointestinal: Denies abdominal pain, bloating, constipation, diarrhea or blood in the stool.  GU: Denies urgency, frequency, pain with urination, burning sensation, blood in urine, odor or discharge. Musculoskeletal: Denies decrease in range of motion, difficulty with gait, muscle pain or joint pain and swelling.  Skin: Denies redness, rashes, lesions or ulcercations.  Neurological: Denies dizziness, difficulty with memory, difficulty with speech or problems with balance and coordination.  Psych: Pt reports history of depression. Denies anxiety, SI/HI.  No other specific complaints in a complete review of systems (except as listed in HPI above).  Objective:   Physical Exam   BP 118/78 mmHg  Pulse 73  Temp(Src) 98.2 F (36.8 C) (Oral)  Ht 5' 3.5" (1.613 m)  Wt 174 lb (78.926 kg)  BMI 30.34 kg/m2  SpO2 98%  LMP 02/16/2015 Wt Readings from Last 3 Encounters:  02/23/15 174 lb (78.926 kg)  09/07/14 161 lb (73.029 kg)  02/27/14 172 lb (78.019 kg)    General: Appears her stated age, obese in NAD. Skin: Warm, dry and intact. No rashes, lesions or ulcerations noted. HEENT: Head: normal shape and size, maxillary sinus tenderness noted; Eyes: sclera white, no icterus, conjunctiva pink, PERRLA  and EOMs intact; Ears: Tm's gray and intact, normal light reflex; Throat/Mouth: Teeth present, mucosa pink and moist, no exudate, lesions or ulcerations noted.  Neck:  Neck supple, trachea midline. No masses, lumps or thyromegaly present.  Cardiovascular: Normal rate and rhythm. S1,S2 noted.  No murmur, rubs or gallops noted. No JVD or BLE edema.  Pulmonary/Chest: Normal effort and positive vesicular breath sounds. No respiratory distress. No wheezes, rales or ronchi noted.  Abdomen: Soft and nontender. Normal bowel sounds. No  distention or masses noted. Liver, spleen and kidneys non palpable. Pelvic: Normal female anatomy. No discharge noted. Cervix friable. No CMT. Adnexa non palpable. Breast with fibrocycstic changes noted. Musculoskeletal: Normal range of motion. No signs of joint swelling. No difficulty with gait.  Neurological: Alert and oriented. Cranial nerves II-XII grossly  intact. Coordination normal.  Psychiatric: Mood and affect normal. Behavior is normal. Judgment and thought content normal.     BMET    Component Value Date/Time   NA 140 11/25/2013 1552   K 4.3 11/25/2013 1552   CL 104 11/25/2013 1552   CO2 26 11/25/2013 1552   GLUCOSE 66* 11/25/2013 1552   BUN 7 11/25/2013 1552   CREATININE 0.68 11/25/2013 1552   CALCIUM 9.1 11/25/2013 1552    Lipid Panel     Component Value Date/Time   CHOL 175 11/25/2013 1552   TRIG 73 11/25/2013 1552   HDL 63 11/25/2013 1552   CHOLHDL 2.8 11/25/2013 1552   VLDL 15 11/25/2013 1552   LDLCALC 97 11/25/2013 1552    CBC    Component Value Date/Time   WBC 7.0 09/08/2013 1633   RBC 4.70 09/08/2013 1633   HGB 14.1 09/08/2013 1633   HCT 41.7 09/08/2013 1633   PLT 303.0 09/08/2013 1633   MCV 88.7 09/08/2013 1633   MCHC 33.9 09/08/2013 1633   RDW 12.9 09/08/2013 1633    Hgb A1C No results found for: HGBA1C      Assessment & Plan:   Preventative Health Maintenance:  She declines flu shot today Tetanus UTD Pap smear today Encouraged her to continue seeing a dentist at least annually Will check CBC, CMET, Lipid, TSH and A1C today Encouraged her to start a healthy diet and exercise regimen  Acute Sinusitis:  Start Flonase BID x 1 week eRx for Amoxil 500 mg TID x 10 days  Depression:   Lexapro refilled today  RTC in 1 year

## 2015-02-23 NOTE — Addendum Note (Signed)
Addended by: Roena Malady on: 02/23/2015 04:20 PM   Modules accepted: Orders

## 2015-02-27 LAB — CYTOLOGY - PAP

## 2015-08-17 ENCOUNTER — Encounter: Payer: Self-pay | Admitting: Sports Medicine

## 2015-08-17 ENCOUNTER — Ambulatory Visit (INDEPENDENT_AMBULATORY_CARE_PROVIDER_SITE_OTHER): Payer: BLUE CROSS/BLUE SHIELD | Admitting: Sports Medicine

## 2015-08-17 ENCOUNTER — Ambulatory Visit (INDEPENDENT_AMBULATORY_CARE_PROVIDER_SITE_OTHER): Payer: BLUE CROSS/BLUE SHIELD

## 2015-08-17 DIAGNOSIS — M722 Plantar fascial fibromatosis: Secondary | ICD-10-CM | POA: Diagnosis not present

## 2015-08-17 DIAGNOSIS — M779 Enthesopathy, unspecified: Secondary | ICD-10-CM

## 2015-08-17 DIAGNOSIS — M79673 Pain in unspecified foot: Secondary | ICD-10-CM | POA: Diagnosis not present

## 2015-08-17 MED ORDER — DICLOFENAC SODIUM 75 MG PO TBEC: 75.0000 mg | DELAYED_RELEASE_TABLET | Freq: Two times a day (BID) | ORAL | 0 refills | Status: DC

## 2015-08-17 MED ORDER — METHYLPREDNISOLONE 4 MG PO TBPK: ORAL_TABLET | ORAL | 0 refills | Status: DC

## 2015-08-17 MED ORDER — TRIAMCINOLONE ACETONIDE 10 MG/ML IJ SUSP: 10.0000 mg | Freq: Once | INTRAMUSCULAR | Status: DC

## 2015-08-17 NOTE — Patient Instructions (Addendum)
You also have Achilles and Peroneal Tendonitis Plantar Fasciitis With Rehab The plantar fascia is a fibrous, ligament-like, soft-tissue structure that spans the bottom of the foot. Plantar fasciitis, also called heel spur syndrome, is a condition that causes pain in the foot due to inflammation of the tissue. SYMPTOMS   Pain and tenderness on the underneath side of the foot.  Pain that worsens with standing or walking. CAUSES  Plantar fasciitis is caused by irritation and injury to the plantar fascia on the underneath side of the foot. Common mechanisms of injury include:  Direct trauma to bottom of the foot.  Damage to a small nerve that runs under the foot where the main fascia attaches to the heel bone.  Stress placed on the plantar fascia due to bone spurs. RISK INCREASES WITH:   Activities that place stress on the plantar fascia (running, jumping, pivoting, or cutting).  Poor strength and flexibility.  Improperly fitted shoes.  Tight calf muscles.  Flat feet.  Failure to warm-up properly before activity.  Obesity. PREVENTION  Warm up and stretch properly before activity.  Allow for adequate recovery between workouts.  Maintain physical fitness:  Strength, flexibility, and endurance.  Cardiovascular fitness.  Maintain a health body weight.  Avoid stress on the plantar fascia.  Wear properly fitted shoes, including arch supports for individuals who have flat feet. PROGNOSIS  If treated properly, then the symptoms of plantar fasciitis usually resolve without surgery. However, occasionally surgery is necessary. RELATED COMPLICATIONS   Recurrent symptoms that may result in a chronic condition.  Problems of the lower back that are caused by compensating for the injury, such as limping.  Pain or weakness of the foot during push-off following surgery.  Chronic inflammation, scarring, and partial or complete fascia tear, occurring more often from repeated  injections. TREATMENT  Treatment initially involves the use of ice and medication to help reduce pain and inflammation. The use of strengthening and stretching exercises may help reduce pain with activity, especially stretches of the Achilles tendon. These exercises may be performed at home or with a therapist. Your caregiver may recommend that you use heel cups of arch supports to help reduce stress on the plantar fascia. Occasionally, corticosteroid injections are given to reduce inflammation. If symptoms persist for greater than 6 months despite non-surgical (conservative), then surgery may be recommended.  MEDICATION   If pain medication is necessary, then nonsteroidal anti-inflammatory medications, such as aspirin and ibuprofen, or other minor pain relievers, such as acetaminophen, are often recommended.  Do not take pain medication within 7 days before surgery.  Prescription pain relievers may be given if deemed necessary by your caregiver. Use only as directed and only as much as you need.  Corticosteroid injections may be given by your caregiver. These injections should be reserved for the most serious cases, because they may only be given a certain number of times. HEAT AND COLD  Cold treatment (icing) relieves pain and reduces inflammation. Cold treatment should be applied for 10 to 15 minutes every 2 to 3 hours for inflammation and pain and immediately after any activity that aggravates your symptoms. Use ice packs or massage the area with a piece of ice (ice massage).  Heat treatment may be used prior to performing the stretching and strengthening activities prescribed by your caregiver, physical therapist, or athletic trainer. Use a heat pack or soak the injury in warm water. SEEK IMMEDIATE MEDICAL CARE IF:  Treatment seems to offer no benefit, or the condition worsens.  Any medications produce adverse side effects. EXERCISES RANGE OF MOTION (ROM) AND STRETCHING EXERCISES -  Plantar Fasciitis (Heel Spur Syndrome) These exercises may help you when beginning to rehabilitate your injury. Your symptoms may resolve with or without further involvement from your physician, physical therapist or athletic trainer. While completing these exercises, remember:   Restoring tissue flexibility helps normal motion to return to the joints. This allows healthier, less painful movement and activity.  An effective stretch should be held for at least 30 seconds.  A stretch should never be painful. You should only feel a gentle lengthening or release in the stretched tissue. RANGE OF MOTION - Toe Extension, Flexion  Sit with your right / left leg crossed over your opposite knee.  Grasp your toes and gently pull them back toward the top of your foot. You should feel a stretch on the bottom of your toes and/or foot.  Hold this stretch for __________ seconds.  Now, gently pull your toes toward the bottom of your foot. You should feel a stretch on the top of your toes and or foot.  Hold this stretch for __________ seconds. Repeat __________ times. Complete this stretch __________ times per day.  RANGE OF MOTION - Ankle Dorsiflexion, Active Assisted  Remove shoes and sit on a chair that is preferably not on a carpeted surface.  Place right / left foot under knee. Extend your opposite leg for support.  Keeping your heel down, slide your right / left foot back toward the chair until you feel a stretch at your ankle or calf. If you do not feel a stretch, slide your bottom forward to the edge of the chair, while still keeping your heel down.  Hold this stretch for __________ seconds. Repeat __________ times. Complete this stretch __________ times per day.  STRETCH - Gastroc, Standing  Place hands on wall.  Extend right / left leg, keeping the front knee somewhat bent.  Slightly point your toes inward on your back foot.  Keeping your right / left heel on the floor and your knee  straight, shift your weight toward the wall, not allowing your back to arch.  You should feel a gentle stretch in the right / left calf. Hold this position for __________ seconds. Repeat __________ times. Complete this stretch __________ times per day. STRETCH - Soleus, Standing  Place hands on wall.  Extend right / left leg, keeping the other knee somewhat bent.  Slightly point your toes inward on your back foot.  Keep your right / left heel on the floor, bend your back knee, and slightly shift your weight over the back leg so that you feel a gentle stretch deep in your back calf.  Hold this position for __________ seconds. Repeat __________ times. Complete this stretch __________ times per day. STRETCH - Gastrocsoleus, Standing  Note: This exercise can place a lot of stress on your foot and ankle. Please complete this exercise only if specifically instructed by your caregiver.   Place the ball of your right / left foot on a step, keeping your other foot firmly on the same step.  Hold on to the wall or a rail for balance.  Slowly lift your other foot, allowing your body weight to press your heel down over the edge of the step.  You should feel a stretch in your right / left calf.  Hold this position for __________ seconds.  Repeat this exercise with a slight bend in your right / left knee. Repeat __________  times. Complete this stretch __________ times per day.  STRENGTHENING EXERCISES - Plantar Fasciitis (Heel Spur Syndrome)  These exercises may help you when beginning to rehabilitate your injury. They may resolve your symptoms with or without further involvement from your physician, physical therapist or athletic trainer. While completing these exercises, remember:   Muscles can gain both the endurance and the strength needed for everyday activities through controlled exercises.  Complete these exercises as instructed by your physician, physical therapist or athletic trainer.  Progress the resistance and repetitions only as guided. STRENGTH - Towel Curls  Sit in a chair positioned on a non-carpeted surface.  Place your foot on a towel, keeping your heel on the floor.  Pull the towel toward your heel by only curling your toes. Keep your heel on the floor.  If instructed by your physician, physical therapist or athletic trainer, add ____________________ at the end of the towel. Repeat __________ times. Complete this exercise __________ times per day. STRENGTH - Ankle Inversion  Secure one end of a rubber exercise band/tubing to a fixed object (table, pole). Loop the other end around your foot just before your toes.  Place your fists between your knees. This will focus your strengthening at your ankle.  Slowly, pull your big toe up and in, making sure the band/tubing is positioned to resist the entire motion.  Hold this position for __________ seconds.  Have your muscles resist the band/tubing as it slowly pulls your foot back to the starting position. Repeat __________ times. Complete this exercises __________ times per day.    This information is not intended to replace advice given to you by your health care provider. Make sure you discuss any questions you have with your health care provider.   Document Released: 01/06/2005 Document Revised: 05/23/2014 Document Reviewed: 04/20/2008 Elsevier Interactive Patient Education [employer]

## 2015-08-17 NOTE — Progress Notes (Signed)
Subjective: Kristina Hancock is a 35 y.o. female patient presents to office with complaint of heel pain on the right and at the lateral side of her foot on the right. Patient admits to post static dyskinesia for 1 year that is worse in the AM but over the last month as gotten worse with working more at work. Admits to Clicking and popping at ankle when walks sometime.  Patient has treated this problem with soaking, motrin, and essential oils with no relief. Denies any other pedal complaints.   Patient Active Problem List   Diagnosis Date Noted  . Depression 11/25/2013    Current Outpatient Prescriptions on File Prior to Visit  Medication Sig Dispense Refill  . amoxicillin (AMOXIL) 500 MG tablet Take 1 tablet (500 mg total) by mouth 3 (three) times daily as needed. 30 tablet 0  . amphetamine-dextroamphetamine (ADDERALL) 20 MG tablet Take 1 tablet (20 mg total) by mouth 2 (two) times daily. (Patient not taking: Reported on 02/23/2015) 60 tablet 0  . escitalopram (LEXAPRO) 20 MG tablet Take 1 tablet (20 mg total) by mouth daily. 90 tablet 1   No current facility-administered medications on file prior to visit.     Allergies  Allergen Reactions  . Hydrocodone Itching    Objective: Physical Exam General: The patient is alert and oriented x3 in no acute distress.  Dermatology: Skin is warm, dry and supple bilateral lower extremities. Nails 1-10 are normal. There is no erythema, edema, no eccymosis, no open lesions present. Integument is otherwise unremarkable.  Vascular: Dorsalis Pedis pulse and Posterior Tibial pulse are 2/4 bilateral. Capillary fill time is immediate to all digits.  Neurological: Grossly intact to light touch with an achilles reflex of +2/5 and a  negative Tinel's sign bilateral.  Musculoskeletal: Tenderness to palpation at the lateral achilles, peroneal tendons and medial calcaneal tubercale and through the insertion of the plantar fascia on the right foot. No pain with  compression of calcaneus bilateral. No pain with tuning fork to calcaneus bilateral. No pain with calf compression bilateral. There is decreased Ankle joint range of motion bilateral. All other joints range of motion within normal limits bilateral. Strength 5/5 in all groups bilateral.   Gait: Unassisted, Antalgic avoid weight on left/right heel  Xray, Right and Left foot:  Normal osseous mineralization. Joint spaces preserved.Supinated foot. 1st ray elevatus. No fracture/dislocation/boney destruction. Calcaneal spur present with mild thickening of plantar fascia. No other soft tissue abnormalities or radiopaque foreign bodies.   Assessment and Plan: Problem List Items Addressed This Visit    None    Visit Diagnoses    Foot pain, unspecified laterality    -  Primary   Relevant Medications   triamcinolone acetonide (KENALOG) 10 MG/ML injection 10 mg   methylPREDNISolone (MEDROL DOSEPAK) 4 MG TBPK tablet   diclofenac (VOLTAREN) 75 MG EC tablet   Other Relevant Orders   DG Foot 2 Views Left   DG Foot 2 Views Right   Plantar fasciitis       R>L   Relevant Medications   triamcinolone acetonide (KENALOG) 10 MG/ML injection 10 mg   methylPREDNISolone (MEDROL DOSEPAK) 4 MG TBPK tablet   diclofenac (VOLTAREN) 75 MG EC tablet   Tendonitis       Right peroneal and lateral achilles   Relevant Medications   triamcinolone acetonide (KENALOG) 10 MG/ML injection 10 mg   methylPREDNISolone (MEDROL DOSEPAK) 4 MG TBPK tablet   diclofenac (VOLTAREN) 75 MG EC tablet     -Complete examination  performed.  -Xrays reviewed -Discussed with patient in detail the condition of plantar fasciitis and tendonitis, how this occurs and general treatment options. Explained both conservative and surgical treatments.  -After oral consent and aseptic prep, injected a mixture containing 1 ml of 2%  plain lidocaine, 1 ml 0.5% plain marcaine, 0.5 ml of kenalog 10 and 0.5 ml of dexamethasone phosphate into right peroneal  tendon course (most tender). Post-injection care discussed with patient.  -Rx Diclofenac to start after Medrol dose pack is completed -Recommended good supportive shoes and advised use of OTC insert. Explained to patient that if these orthoses work well, we will continue with these. If these do not improve her condition and  pain, we will consider custom molded orthoses. - Explained in detail the use of the fascial brace which was dispensed at today's visit. -Explained and dispensed to patient daily stretching exercises. -Recommend patient to ice affected area 1-2x daily. -Patient to return to office in 3 weeks for follow up or sooner if problems or questions arise.  Asencion Islam, DPM

## 2015-08-24 ENCOUNTER — Ambulatory Visit: Payer: BLUE CROSS/BLUE SHIELD | Admitting: Sports Medicine

## 2015-08-31 ENCOUNTER — Encounter: Payer: Self-pay | Admitting: Sports Medicine

## 2015-08-31 ENCOUNTER — Ambulatory Visit (INDEPENDENT_AMBULATORY_CARE_PROVIDER_SITE_OTHER): Payer: BLUE CROSS/BLUE SHIELD | Admitting: Sports Medicine

## 2015-08-31 DIAGNOSIS — R609 Edema, unspecified: Secondary | ICD-10-CM | POA: Diagnosis not present

## 2015-08-31 DIAGNOSIS — M722 Plantar fascial fibromatosis: Secondary | ICD-10-CM | POA: Diagnosis not present

## 2015-08-31 DIAGNOSIS — M79673 Pain in unspecified foot: Secondary | ICD-10-CM | POA: Diagnosis not present

## 2015-08-31 DIAGNOSIS — M779 Enthesopathy, unspecified: Secondary | ICD-10-CM | POA: Diagnosis not present

## 2015-08-31 MED ORDER — TRIAMCINOLONE ACETONIDE 10 MG/ML IJ SUSP: 10.0000 mg | Freq: Once | INTRAMUSCULAR | Status: DC

## 2015-08-31 NOTE — Progress Notes (Signed)
Subjective: Kristina Hancock is a 35 y.o. female patient returns to office for follow up eval of right heel and lateral ankle pain after injection at peroneal tendon. Patient states that the injection helped for 2 days and that none of the medications helped. States that she feels like the brace is aggrevating the area. Has been walking with a limp because of pain. Has been stretching and icing and reports icing helps most. Denies any other pedal complaints.   Patient Active Problem List   Diagnosis Date Noted  . Depression 11/25/2013    Current Outpatient Prescriptions on File Prior to Visit  Medication Sig Dispense Refill  . amoxicillin (AMOXIL) 500 MG tablet Take 1 tablet (500 mg total) by mouth 3 (three) times daily as needed. 30 tablet 0  . amphetamine-dextroamphetamine (ADDERALL) 20 MG tablet Take 1 tablet (20 mg total) by mouth 2 (two) times daily. (Patient not taking: Reported on 02/23/2015) 60 tablet 0  . diclofenac (VOLTAREN) 75 MG EC tablet Take 1 tablet (75 mg total) by mouth 2 (two) times daily. 30 tablet 0  . escitalopram (LEXAPRO) 20 MG tablet Take 1 tablet (20 mg total) by mouth daily. 90 tablet 1  . methylPREDNISolone (MEDROL DOSEPAK) 4 MG TBPK tablet Take 1st as instructed 21 tablet 0   Current Facility-Administered Medications on File Prior to Visit  Medication Dose Route Frequency Provider Last Rate Last Dose  . triamcinolone acetonide (KENALOG) 10 MG/ML injection 10 mg  10 mg Other Once Asencion Islamitorya Salli Bodin, DPM        Allergies  Allergen Reactions  . Hydrocodone Itching    Objective: Physical Exam General: The patient is alert and oriented x3 in no acute distress.  Dermatology: Skin is warm, dry and supple bilateral lower extremities. Nails 1-10 are normal. There is no erythema, edema, no eccymosis, no open lesions present. Integument is otherwise unremarkable.  Vascular: Dorsalis Pedis pulse and Posterior Tibial pulse are 2/4 bilateral. Capillary fill time is immediate to  all digits.  Neurological: Grossly intact to light touch with an achilles reflex of +2/5 and a negative Tinel's sign bilateral.  Musculoskeletal: Tenderness to palpation at the lateral achilles, peroneal tendons and medial calcaneal tubercale and through the insertion of the plantar fascia (most tender) on the right foot with focal swelling. No pain with compression of calcaneus bilateral. No pain with tuning fork to calcaneus bilateral. No pain with calf compression bilateral. There is decreased Ankle joint range of motion bilateral. All other joints range of motion within normal limits bilateral. Strength 5/5 in all groups bilateral.   Assessment and Plan: Problem List Items Addressed This Visit    None    Visit Diagnoses    Foot pain, unspecified laterality    -  Primary   right   Relevant Medications   triamcinolone acetonide (KENALOG) 10 MG/ML injection 10 mg   Plantar fasciitis       right   Relevant Medications   triamcinolone acetonide (KENALOG) 10 MG/ML injection 10 mg   Tendonitis       right peroneal tendonitis   Swelling         -Complete examination performed.  -Discussed with patient in detail the condition of plantar fasciitis and tendonitis, how this occurs and general treatment options. Explained both conservative and surgical treatments.  -After oral consent and aseptic prep, injected a mixture containing 1 ml of 2% plain lidocaine, 1 ml 0.5% plain marcaine, 0.5 ml of kenalog 10 and 0.5 ml of dexamethasone phosphate into  right plantar fasica (most tender). Post-injection care discussed with patient.  -Applied unna boot and dispensed post op shoe; to leave unna boot intact for 5 days and then slowly transition to good supportive sneaker with heel cushion -Discontinue fascial brace at this time -Finish diclofenac -Recommend patient to ice affected area 1-2x daily. -Patient to return to office in 2 weeks for follow up or sooner if problems or questions arise. If no  improvement may consider MRI.   Asencion Islam, DPM

## 2015-09-07 ENCOUNTER — Ambulatory Visit: Payer: BLUE CROSS/BLUE SHIELD | Admitting: Sports Medicine

## 2015-09-14 ENCOUNTER — Ambulatory Visit (INDEPENDENT_AMBULATORY_CARE_PROVIDER_SITE_OTHER): Payer: BLUE CROSS/BLUE SHIELD | Admitting: Sports Medicine

## 2015-09-14 ENCOUNTER — Encounter: Payer: Self-pay | Admitting: Sports Medicine

## 2015-09-14 ENCOUNTER — Telehealth: Payer: Self-pay | Admitting: *Deleted

## 2015-09-14 DIAGNOSIS — M779 Enthesopathy, unspecified: Secondary | ICD-10-CM | POA: Diagnosis not present

## 2015-09-14 DIAGNOSIS — M79673 Pain in unspecified foot: Secondary | ICD-10-CM

## 2015-09-14 DIAGNOSIS — R609 Edema, unspecified: Secondary | ICD-10-CM

## 2015-09-14 DIAGNOSIS — T148XXA Other injury of unspecified body region, initial encounter: Secondary | ICD-10-CM

## 2015-09-14 DIAGNOSIS — M722 Plantar fascial fibromatosis: Secondary | ICD-10-CM

## 2015-09-14 MED ORDER — DICLOFENAC SODIUM 75 MG PO TBEC: 75.0000 mg | DELAYED_RELEASE_TABLET | Freq: Two times a day (BID) | ORAL | 0 refills | Status: DC

## 2015-09-14 NOTE — Telephone Encounter (Signed)
-----   Message from [place], [place] sent at 09/14/2015  1:49 PM EDT ----- Regarding: MRI Right foot Right foot at ankle pain at plantar fascial, peroneals, and new pain at 5th MTPJ. R/o partial tendon tear -Dr. Marylene LandStover

## 2015-09-15 NOTE — Progress Notes (Signed)
Subjective: Kristina Hancock is a 35 y.o. female patient returns to office for follow up eval of right heel and lateral ankle pain after injection at plantar fascia #1, 3 weeks ago. Patient states that the injection helped and made her heel feel a little better however has pain and swelling at bottom of heel and side of ankle now that radiates to 5th MTPJ on right. States she feels like the inflamation is down but still has pain. Denies any other pedal complaints.   Admits to family history of RA and several other painful areas besides her foot.  Patient Active Problem List   Diagnosis Date Noted  . Depression 11/25/2013    Current Outpatient Prescriptions on File Prior to Visit  Medication Sig Dispense Refill  . amoxicillin (AMOXIL) 500 MG tablet Take 1 tablet (500 mg total) by mouth 3 (three) times daily as needed. 30 tablet 0  . amphetamine-dextroamphetamine (ADDERALL) 20 MG tablet Take 1 tablet (20 mg total) by mouth 2 (two) times daily. (Patient not taking: Reported on 02/23/2015) 60 tablet 0  . escitalopram (LEXAPRO) 20 MG tablet Take 1 tablet (20 mg total) by mouth daily. 90 tablet 1  . methylPREDNISolone (MEDROL DOSEPAK) 4 MG TBPK tablet Take 1st as instructed 21 tablet 0   Current Facility-Administered Medications on File Prior to Visit  Medication Dose Route Frequency Provider Last Rate Last Dose  . triamcinolone acetonide (KENALOG) 10 MG/ML injection 10 mg  10 mg Other Once [employer], DPM      . triamcinolone acetonide (KENALOG) 10 MG/ML injection 10 mg  10 mg Other Once Landis Martins, DPM        Allergies  Allergen Reactions  . Hydrocodone Itching    Objective: Physical Exam General: The patient is alert and oriented x3 in no acute distress.  Dermatology: Skin is warm, dry and supple bilateral lower extremities. Nails 1-10 are normal. There is no erythema, edema, no eccymosis, no open lesions present. Integument is otherwise unremarkable.  Vascular: Dorsalis Pedis  pulse and Posterior Tibial pulse are 2/4 bilateral. Capillary fill time is immediate to all digits.  Neurological: Grossly intact to light touch with an achilles reflex of +2/5 and a negative Tinel's sign bilateral.  Musculoskeletal: Tenderness to palpation at the lateral achilles, peroneal tendons, 5th MTPJ and medial calcaneal tubercale and through the insertion of the plantar fascia on the right foot with decreased focal swelling. No pain with compression of calcaneus bilateral. No pain with tuning fork to calcaneus bilateral. No pain with calf compression bilateral. There is decreased Ankle joint range of motion bilateral. All other joints range of motion within normal limits bilateral. Strength 5/5 in all groups bilateral.   Assessment and Plan: Problem List Items Addressed This Visit    None    Visit Diagnoses    Foot pain, unspecified laterality    -  Primary   Relevant Medications   diclofenac (VOLTAREN) 75 MG EC tablet   Other Relevant Orders   CBC with Differential (Completed)   Basic Metabolic Panel (Completed)   Sedimentation Rate (Completed)   C-reactive protein (Completed)   HLA-B27 Antigen (Completed)   Rheumatoid factor (Completed)   ANA (Completed)   Uric Acid (Completed)   Swelling       Relevant Orders   CBC with Differential (Completed)   Basic Metabolic Panel (Completed)   Sedimentation Rate (Completed)   C-reactive protein (Completed)   HLA-B27 Antigen (Completed)   Rheumatoid factor (Completed)   ANA (Completed)   Uric  Acid (Completed)   Plantar fasciitis       R>L   Relevant Medications   diclofenac (VOLTAREN) 75 MG EC tablet   Tendonitis       Right peroneal and lateral achilles   Relevant Medications   diclofenac (VOLTAREN) 75 MG EC tablet     -Complete examination performed.  -Discussed with patient in detail the condition of plantar fasciitis and tendonitis, how this occurs and general treatment options. Explained both conservative and surgical  treatments.  -No injection at today's encounter will consider another injection after MRI -Rx MRI for further eval r/o partial tear -Ordered arthritic panel  -Refilled diclofenac -Recommend patient to ice affected area 1-2x daily. -Continue with good supportive shoes daily and heel cushions -Patient to return to office after MRI or sooner if problems or questions arise.   Landis Martins, DPM

## 2015-09-20 ENCOUNTER — Telehealth: Payer: Self-pay | Admitting: Sports Medicine

## 2015-09-20 NOTE — Telephone Encounter (Signed)
Pt called wanting to know if Dr. Marylene LandStover could call her and tell her about her results. She stated dr stover would call her when her results came in

## 2015-09-21 LAB — CBC WITH DIFFERENTIAL/PLATELET
BASOS: 1 %
Basophils Absolute: 0 10*3/uL (ref 0.0–0.2)
EOS (ABSOLUTE): 0.2 10*3/uL (ref 0.0–0.4)
EOS: 3 %
HEMATOCRIT: 42.7 % (ref 34.0–46.6)
HEMOGLOBIN: 14.4 g/dL (ref 11.1–15.9)
IMMATURE GRANS (ABS): 0 10*3/uL (ref 0.0–0.1)
Immature Granulocytes: 0 %
LYMPHS ABS: 2.4 10*3/uL (ref 0.7–3.1)
Lymphs: 42 %
MCH: 29 pg (ref 26.6–33.0)
MCHC: 33.7 g/dL (ref 31.5–35.7)
MCV: 86 fL (ref 79–97)
MONOCYTES: 8 %
Monocytes Absolute: 0.4 10*3/uL (ref 0.1–0.9)
NEUTROS ABS: 2.7 10*3/uL (ref 1.4–7.0)
Neutrophils: 46 %
Platelets: 328 10*3/uL (ref 150–379)
RBC: 4.97 x10E6/uL (ref 3.77–5.28)
RDW: 12.9 % (ref 12.3–15.4)
WBC: 5.7 10*3/uL (ref 3.4–10.8)

## 2015-09-21 LAB — BASIC METABOLIC PANEL
BUN / CREAT RATIO: 15 (ref 9–23)
BUN: 11 mg/dL (ref 6–20)
CO2: 25 mmol/L (ref 18–29)
CREATININE: 0.75 mg/dL (ref 0.57–1.00)
Calcium: 9.7 mg/dL (ref 8.7–10.2)
Chloride: 100 mmol/L (ref 96–106)
GFR calc Af Amer: 119 mL/min/{1.73_m2} (ref >59)
GFR, EST NON AFRICAN AMERICAN: 104 mL/min/{1.73_m2} (ref >59)
Glucose: 131 mg/dL — ABNORMAL HIGH (ref 65–99)
Potassium: 4 mmol/L (ref 3.5–5.2)
SODIUM: 140 mmol/L (ref 134–144)

## 2015-09-21 LAB — URIC ACID: URIC ACID: 4.3 mg/dL (ref 2.5–7.1)

## 2015-09-21 LAB — C-REACTIVE PROTEIN: CRP: 0.8 mg/L (ref 0.0–4.9)

## 2015-09-21 LAB — RHEUMATOID FACTOR

## 2015-09-21 LAB — HLA-B27 ANTIGEN: HLA B27: NEGATIVE

## 2015-09-21 LAB — ANA: ANA: NEGATIVE

## 2015-09-21 LAB — SEDIMENTATION RATE: Sed Rate: 2 mm/hr (ref 0–32)

## 2015-09-25 ENCOUNTER — Other Ambulatory Visit: Payer: Self-pay

## 2015-09-25 ENCOUNTER — Telehealth: Payer: Self-pay

## 2015-09-25 DIAGNOSIS — M779 Enthesopathy, unspecified: Secondary | ICD-10-CM

## 2015-09-25 DIAGNOSIS — R609 Edema, unspecified: Secondary | ICD-10-CM

## 2015-09-25 DIAGNOSIS — T148XXA Other injury of unspecified body region, initial encounter: Secondary | ICD-10-CM

## 2015-09-25 NOTE — Telephone Encounter (Signed)
LVM informing patient that her arthritic panel was negative and all other blood work was within normal limits. In regards to her concern regarding her MRI that was ordered, orders were printed today and given to Delydia to prior auth. Informed patient that she should hear from schedulers soon to get an appt for her MRI

## 2015-09-25 NOTE — Telephone Encounter (Signed)
[facility], # [id number]

## 2015-10-02 ENCOUNTER — Other Ambulatory Visit: Payer: Self-pay

## 2015-10-02 MED ORDER — TRAMADOL HCL 50 MG PO TABS: 50.0000 mg | ORAL_TABLET | Freq: Three times a day (TID) | ORAL | 0 refills | Status: DC | PRN

## 2015-10-26 ENCOUNTER — Ambulatory Visit: Payer: BLUE CROSS/BLUE SHIELD

## 2015-12-20 ENCOUNTER — Other Ambulatory Visit: Payer: Self-pay | Admitting: Internal Medicine

## 2016-01-11 ENCOUNTER — Telehealth: Payer: BLUE CROSS/BLUE SHIELD | Admitting: Family

## 2016-01-11 DIAGNOSIS — B9789 Other viral agents as the cause of diseases classified elsewhere: Secondary | ICD-10-CM

## 2016-01-11 DIAGNOSIS — J069 Acute upper respiratory infection, unspecified: Secondary | ICD-10-CM

## 2016-01-11 MED ORDER — BENZONATATE 100 MG PO CAPS: 100.0000 mg | ORAL_CAPSULE | Freq: Two times a day (BID) | ORAL | 0 refills | Status: DC | PRN

## 2016-01-11 NOTE — Progress Notes (Signed)
We are sorry that you are not feeling well.  Here is how we plan to help!  Based on what you have shared with me it looks like you have upper respiratory tract inflammation that has resulted in a significant cough.  Inflammation and infection in the upper respiratory tract is commonly called bronchitis and has four common causes:  Allergies, Viral Infections, Acid Reflux and Bacterial Infections.  Allergies, viruses and acid reflux are treated by controlling symptoms or eliminating the cause. An example might be a cough caused by taking certain blood pressure medications. You stop the cough by changing the medication. Another example might be a cough caused by acid reflux. Controlling the reflux helps control the cough.  Based on your presentation I believe you most likely have A cough due to a virus.  This is called viral bronchitis and is best treated by rest, plenty of fluids and control of the cough.  You may use Ibuprofen or Tylenol as directed to help your symptoms.     In addition you may use A non-prescription cough medication called Robitussin DAC. Take 2 teaspoons every 8 hours or Delsym: take 2 teaspoons every 12 hours., A non-prescription cough medication called Mucinex DM: take 2 tablets every 12 hours. and A prescription cough medication called Tessalon Perles 100mg. You may take 1-2 capsules every 8 hours as needed for your cough.   USE OF BRONCHODILATOR ("RESCUE") INHALERS: There is a risk from using your bronchodilator too frequently.  The risk is that over-reliance on a medication which only relaxes the muscles surrounding the breathing tubes can reduce the effectiveness of medications prescribed to reduce swelling and congestion of the tubes themselves.  Although you feel brief relief from the bronchodilator inhaler, your asthma may actually be worsening with the tubes becoming more swollen and filled with mucus.  This can delay other crucial treatments, such as oral steroid medications.  If you need to use a bronchodilator inhaler daily, several times per day, you should discuss this with your provider.  There are probably better treatments that could be used to keep your asthma under control.     HOME CARE . Only take medications as instructed by your medical team. . Complete the entire course of an antibiotic. . Drink plenty of fluids and get plenty of rest. . Avoid close contacts especially the very young and the elderly . Cover your mouth if you cough or cough into your sleeve. . Always remember to wash your hands . A steam or ultrasonic humidifier can help congestion.   GET HELP RIGHT AWAY IF: . You develop worsening fever. . You become short of breath . You cough up blood. . Your symptoms persist after you have completed your treatment plan MAKE SURE YOU   Understand these instructions.  Will watch your condition.  Will get help right away if you are not doing well or get worse.  Your e-visit answers were reviewed by a board certified advanced clinical practitioner to complete your personal care plan.  Depending on the condition, your plan could have included both over the counter or prescription medications. If there is a problem please reply  once you have received a response from your provider. Your safety is important to us.  If you have drug allergies check your prescription carefully.    You can use MyChart to ask questions about today's visit, request a non-urgent call back, or ask for a work or school excuse for 24 hours related to this e-Visit.   If it has been greater than 24 hours you will need to follow up with your provider, or enter a new e-Visit to address those concerns. You will get an e-mail in the next two days asking about your experience.  I hope that your e-visit has been valuable and will speed your recovery. Thank you for using e-visits.   

## 2016-01-16 DIAGNOSIS — Z1389 Encounter for screening for other disorder: Secondary | ICD-10-CM | POA: Diagnosis not present

## 2016-01-16 DIAGNOSIS — J0100 Acute maxillary sinusitis, unspecified: Secondary | ICD-10-CM | POA: Diagnosis not present

## 2016-03-27 ENCOUNTER — Other Ambulatory Visit: Payer: Self-pay | Admitting: Internal Medicine

## 2016-03-28 DIAGNOSIS — J029 Acute pharyngitis, unspecified: Secondary | ICD-10-CM | POA: Diagnosis not present

## 2016-06-18 ENCOUNTER — Other Ambulatory Visit: Payer: Self-pay | Admitting: Internal Medicine

## 2016-06-23 ENCOUNTER — Other Ambulatory Visit: Payer: Self-pay | Admitting: Internal Medicine

## 2016-06-27 NOTE — Telephone Encounter (Signed)
Pt called requesting refill escitalopram; I spoke with [facility] [address] and had 06/18/16 refill on hold because few days too early; will get ready today. Pt voiced understanding.

## 2016-08-08 ENCOUNTER — Ambulatory Visit: Payer: BLUE CROSS/BLUE SHIELD

## 2016-08-29 ENCOUNTER — Encounter: Payer: BLUE CROSS/BLUE SHIELD | Admitting: Internal Medicine

## 2016-09-19 ENCOUNTER — Ambulatory Visit (INDEPENDENT_AMBULATORY_CARE_PROVIDER_SITE_OTHER): Payer: BLUE CROSS/BLUE SHIELD | Admitting: Internal Medicine

## 2016-09-19 ENCOUNTER — Encounter: Payer: Self-pay | Admitting: Internal Medicine

## 2016-09-19 VITALS — BP 116/80 | HR 81 | Temp 98.6°F | Ht 63.5 in | Wt 174.0 lb

## 2016-09-19 DIAGNOSIS — F329 Major depressive disorder, single episode, unspecified: Secondary | ICD-10-CM | POA: Diagnosis not present

## 2016-09-19 DIAGNOSIS — Z0000 Encounter for general adult medical examination without abnormal findings: Secondary | ICD-10-CM

## 2016-09-19 LAB — CBC
HCT: 41.6 % (ref 35.0–45.0)
Hemoglobin: 13.9 g/dL (ref 11.7–15.5)
MCH: 29.2 pg (ref 27.0–33.0)
MCHC: 33.4 g/dL (ref 32.0–36.0)
MCV: 87.4 fL (ref 80.0–100.0)
MPV: 10.2 fL (ref 7.5–12.5)
PLATELETS: 363 10*3/uL (ref 140–400)
RBC: 4.76 MIL/uL (ref 3.80–5.10)
RDW: 12.7 % (ref 11.0–15.0)
WBC: 7.6 10*3/uL (ref 3.8–10.8)

## 2016-09-19 MED ORDER — ESCITALOPRAM OXALATE 20 MG PO TABS: 20.0000 mg | ORAL_TABLET | Freq: Every day | ORAL | 3 refills | Status: DC

## 2016-09-19 NOTE — Progress Notes (Signed)
Subjective:    Patient ID: Kristina Hancock, female    DOB: Jan 16, 1981, 36 y.o.   MRN: [id number]  HPI  Pt presents to the clinic today for her annual exam. She is also due to follow up chronic conditions.  Depression: She is managed on Lexapro. She would like a refill today. She denies SI/HI.  Flu: never Tetanus: ? 2014 Pap Smear: 02/2015 Dentist: biannually  Diet: She does eat meat. She consumes fruits and veggies daily. She does eat fried foods. She drinks mostly water. Exercise: None  Review of Systems      Past Medical History:  Diagnosis Date  . Depression     Current Outpatient Prescriptions  Medication Sig Dispense Refill  . amoxicillin (AMOXIL) 500 MG tablet Take 1 tablet (500 mg total) by mouth 3 (three) times daily as needed. 30 tablet 0  . amphetamine-dextroamphetamine (ADDERALL) 20 MG tablet Take 1 tablet (20 mg total) by mouth 2 (two) times daily. (Patient not taking: Reported on 02/23/2015) 60 tablet 0  . benzonatate (TESSALON) 100 MG capsule Take 1 capsule (100 mg total) by mouth 2 (two) times daily as needed for cough. 20 capsule 0  . diclofenac (VOLTAREN) 75 MG EC tablet Take 1 tablet (75 mg total) by mouth 2 (two) times daily. 30 tablet 0  . escitalopram (LEXAPRO) 20 MG tablet Take 1 tablet (20 mg total) by mouth daily. MUST SCHEDULE ANNUAL PHYSICAL FOR FURTHER REFILLS 90 tablet 0  . escitalopram (LEXAPRO) 20 MG tablet Take 1 tablet (20 mg total) by mouth daily. MUST SCHEDULE ANNUAL EXAM FOR FURTHER REFILLS 90 tablet 0  . methylPREDNISolone (MEDROL DOSEPAK) 4 MG TBPK tablet Take 1st as instructed 21 tablet 0  . traMADol (ULTRAM) 50 MG tablet Take 1 tablet (50 mg total) by mouth every 8 (eight) hours as needed. 30 tablet 0   Current Facility-Administered Medications  Medication Dose Route Frequency Provider Last Rate Last Dose  . triamcinolone acetonide (KENALOG) 10 MG/ML injection 10 mg  10 mg Other Once Asencion Islam, DPM      . triamcinolone acetonide  (KENALOG) 10 MG/ML injection 10 mg  10 mg Other Once Asencion Islam, DPM        Allergies  Allergen Reactions  . Hydrocodone Itching    Family History  Problem Relation Age of Onset  . Hypertension Brother   . Stroke Maternal Uncle   . Diabetes Maternal Uncle   . Cancer Maternal Grandmother        ovarin  . Heart disease Paternal Grandfather     Social History   Social History  . Marital status: Single    Spouse name: N/A  . Number of children: N/A  . Years of education: N/A   Occupational History  . Not on file.   Social History Main Topics  . Smoking status: Former Games developer  . Smokeless tobacco: Not on file     Comment: quit 12 years ago  . Alcohol use Yes     Comment: rare  . Drug use: No  . Sexual activity: No   Other Topics Concern  . Not on file   Social History Narrative  . No narrative on file     Constitutional: Denies fever, malaise, fatigue, headache or abrupt weight changes.  HEENT: Denies eye pain, eye redness, ear pain, ringing in the ears, wax buildup, runny nose, nasal congestion, bloody nose, or sore throat. Respiratory: Denies difficulty breathing, shortness of breath, cough or sputum production.   Cardiovascular: Denies chest  pain, chest tightness, palpitations or swelling in the hands or feet.  Gastrointestinal: Denies abdominal pain, bloating, constipation, diarrhea or blood in the stool.  GU: Denies urgency, frequency, pain with urination, burning sensation, blood in urine, odor or discharge. Musculoskeletal: Denies decrease in range of motion, difficulty with gait, muscle pain or joint pain and swelling.  Skin: Denies redness, rashes, lesions or ulcercations.  Neurological: Denies dizziness, difficulty with memory, difficulty with speech or problems with balance and coordination.  Psych: Pt has a history of depression. Denies anxiety, SI/HI.  No other specific complaints in a complete review of systems (except as listed in HPI  above).  Objective:   Physical Exam  BP 116/80   Pulse 81   Temp 98.6 F (37 C) (Oral)   Ht 5' 3.5" (1.613 m)   Wt 174 lb (78.9 kg)   LMP 09/02/2016   SpO2 97%   BMI 30.34 kg/m  Wt Readings from Last 3 Encounters:  09/19/16 174 lb (78.9 kg)  02/23/15 174 lb (78.9 kg)  09/07/14 161 lb (73 kg)    General: Appears her stated age, obese in NAD. Skin: Warm, dry and intact.  HEENT: Head: normal shape and size; Eyes: sclera white, no icterus, conjunctiva pink, PERRLA and EOMs intact; Ears: Tm's gray and intact, normal light reflex; Throat/Mouth: Teeth present, mucosa pink and moist, no exudate, lesions or ulcerations noted.  Neck:  Neck supple, trachea midline. No masses, lumps or thyromegaly present.  Cardiovascular: Normal rate and rhythm. S1,S2 noted.  No murmur, rubs or gallops noted. No JVD or BLE edema.  Pulmonary/Chest: Normal effort and positive vesicular breath sounds. No respiratory distress. No wheezes, rales or ronchi noted.  Abdomen: Soft and nontender. Normal bowel sounds. No distention or masses noted. Liver, spleen and kidneys non palpable. Musculoskeletal: Strength 5/5 BUE/BLE. No difficulty with gait.  Neurological: Alert and oriented. Cranial nerves II-XII grossly intact. Coordination normal.  Psychiatric: Mood and affect normal. Behavior is normal. Judgment and thought content normal.     BMET    Component Value Date/Time   NA 140 09/14/2015 1454   K 4.0 09/14/2015 1454   CL 100 09/14/2015 1454   CO2 25 09/14/2015 1454   GLUCOSE 131 (H) 09/14/2015 1454   GLUCOSE 86 02/23/2015 1508   BUN 11 09/14/2015 1454   CREATININE 0.75 09/14/2015 1454   CREATININE 0.68 11/25/2013 1552   CALCIUM 9.7 09/14/2015 1454   GFRNONAA 104 09/14/2015 1454   GFRAA 119 09/14/2015 1454    Lipid Panel     Component Value Date/Time   CHOL 197 02/23/2015 1508   TRIG 183.0 (H) 02/23/2015 1508   HDL 60.90 02/23/2015 1508   CHOLHDL 3 02/23/2015 1508   VLDL 36.6 02/23/2015 1508    LDLCALC 100 (H) 02/23/2015 1508    CBC    Component Value Date/Time   WBC 5.7 09/14/2015 1454   WBC 6.1 02/23/2015 1508   RBC 4.97 09/14/2015 1454   RBC 5.08 02/23/2015 1508   HGB 14.4 09/14/2015 1454   HCT 42.7 09/14/2015 1454   PLT 328 09/14/2015 1454   MCV 86 09/14/2015 1454   MCH 29.0 09/14/2015 1454   MCHC 33.7 09/14/2015 1454   MCHC 33.7 02/23/2015 1508   RDW 12.9 09/14/2015 1454   LYMPHSABS 2.4 09/14/2015 1454   EOSABS 0.2 09/14/2015 1454   BASOSABS 0.0 09/14/2015 1454    Hgb A1C Lab Results  Component Value Date   HGBA1C 5.2 02/23/2015  Assessment & Plan:   Preventative Health Maintenance:  Encouraged her to get a flu shot in the fall Tetanus UTD Pap smear UTD Encouraged her to consume a balanced diet and exercise regimen Advised her to see a dentist annually Will check CBC, CMET, Lipid and A1C today  RTC in 1 year, sooner if needed Nicki Reaper, NP

## 2016-09-19 NOTE — Assessment & Plan Note (Signed)
Stable on Lexapro Refilled today 

## 2016-09-19 NOTE — Patient Instructions (Signed)

## 2016-09-20 LAB — COMPREHENSIVE METABOLIC PANEL
ALBUMIN: 4.3 g/dL (ref 3.6–5.1)
ALT: 14 U/L (ref 6–29)
AST: 10 U/L (ref 10–30)
Alkaline Phosphatase: 61 U/L (ref 33–115)
BUN: 9 mg/dL (ref 7–25)
CHLORIDE: 104 mmol/L (ref 98–110)
CO2: 25 mmol/L (ref 20–32)
CREATININE: 0.82 mg/dL (ref 0.50–1.10)
Calcium: 9.4 mg/dL (ref 8.6–10.2)
GLUCOSE: 135 mg/dL — AB (ref 65–99)
Potassium: 3.6 mmol/L (ref 3.5–5.3)
SODIUM: 141 mmol/L (ref 135–146)
Total Bilirubin: 0.6 mg/dL (ref 0.2–1.2)
Total Protein: 6.1 g/dL (ref 6.1–8.1)

## 2016-09-20 LAB — LIPID PANEL
Cholesterol: 210 mg/dL — ABNORMAL HIGH (ref <200)
HDL: 65 mg/dL (ref >50)
LDL CALC: 126 mg/dL — AB (ref <100)
TRIGLYCERIDES: 97 mg/dL (ref <150)
Total CHOL/HDL Ratio: 3.2 Ratio (ref <5.0)
VLDL: 19 mg/dL (ref <30)

## 2016-09-20 LAB — HEMOGLOBIN A1C
HEMOGLOBIN A1C: 4.8 % (ref <5.7)
Mean Plasma Glucose: 91 mg/dL

## 2016-10-24 ENCOUNTER — Ambulatory Visit: Payer: BLUE CROSS/BLUE SHIELD

## 2017-09-20 ENCOUNTER — Other Ambulatory Visit: Payer: Self-pay | Admitting: Internal Medicine

## 2017-10-20 ENCOUNTER — Encounter: Payer: BLUE CROSS/BLUE SHIELD | Admitting: Internal Medicine

## 2017-10-22 ENCOUNTER — Ambulatory Visit (INDEPENDENT_AMBULATORY_CARE_PROVIDER_SITE_OTHER): Payer: BLUE CROSS/BLUE SHIELD | Admitting: Internal Medicine

## 2017-10-22 ENCOUNTER — Encounter: Payer: Self-pay | Admitting: Internal Medicine

## 2017-10-22 VITALS — BP 116/78 | HR 71 | Temp 98.5°F | Ht 64.0 in | Wt 172.0 lb

## 2017-10-22 DIAGNOSIS — N943 Premenstrual tension syndrome: Secondary | ICD-10-CM | POA: Diagnosis not present

## 2017-10-22 DIAGNOSIS — F329 Major depressive disorder, single episode, unspecified: Secondary | ICD-10-CM | POA: Diagnosis not present

## 2017-10-22 DIAGNOSIS — Z0000 Encounter for general adult medical examination without abnormal findings: Secondary | ICD-10-CM

## 2017-10-22 LAB — LIPID PANEL
Cholesterol: 185 mg/dL (ref 0–200)
HDL: 57.1 mg/dL (ref >39.00)
LDL CALC: 107 mg/dL — AB (ref 0–99)
NonHDL: 127.46
Total CHOL/HDL Ratio: 3
Triglycerides: 100 mg/dL (ref 0.0–149.0)
VLDL: 20 mg/dL (ref 0.0–40.0)

## 2017-10-22 LAB — COMPREHENSIVE METABOLIC PANEL
ALBUMIN: 4.3 g/dL (ref 3.5–5.2)
ALK PHOS: 55 U/L (ref 39–117)
ALT: 14 U/L (ref 0–35)
AST: 10 U/L (ref 0–37)
BILIRUBIN TOTAL: 0.7 mg/dL (ref 0.2–1.2)
BUN: 11 mg/dL (ref 6–23)
CO2: 33 mEq/L — ABNORMAL HIGH (ref 19–32)
CREATININE: 0.74 mg/dL (ref 0.40–1.20)
Calcium: 9.5 mg/dL (ref 8.4–10.5)
Chloride: 102 mEq/L (ref 96–112)
GFR: 93.49 mL/min (ref >60.00)
Glucose, Bld: 90 mg/dL (ref 70–99)
Potassium: 4.1 mEq/L (ref 3.5–5.1)
SODIUM: 139 meq/L (ref 135–145)
TOTAL PROTEIN: 6.9 g/dL (ref 6.0–8.3)

## 2017-10-22 LAB — CBC
HCT: 43 % (ref 36.0–46.0)
Hemoglobin: 14.6 g/dL (ref 12.0–15.0)
MCHC: 33.9 g/dL (ref 30.0–36.0)
MCV: 87.3 fl (ref 78.0–100.0)
Platelets: 289 10*3/uL (ref 150.0–400.0)
RBC: 4.92 Mil/uL (ref 3.87–5.11)
RDW: 12.9 % (ref 11.5–15.5)
WBC: 5.7 10*3/uL (ref 4.0–10.5)

## 2017-10-22 LAB — HEMOGLOBIN A1C: HEMOGLOBIN A1C: 5.2 % (ref 4.6–6.5)

## 2017-10-22 MED ORDER — NORETHIN ACE-ETH ESTRAD-FE 1-20 MG-MCG PO TABS: 1.0000 | ORAL_TABLET | Freq: Every day | ORAL | 11 refills | Status: DC

## 2017-10-22 NOTE — Progress Notes (Signed)
Subjective:    Patient ID: Kristina Hancock, female    DOB: March 20, 1980, 37 y.o.   MRN: [id number]  HPI  Pt presents to the clinic today for her annual exam.  Depression: Chronic but stable on Lexapro. She has been experiencing PMS lately. She denies anxiety, SI/HI.  Flu: never Tetanus: 2014 Pap Smear: 02/2015 Dentist: biannually  Diet: She does eat meat. She consumes fruits and veggies daily. She tries to avoid fried foods. She drinks mostly water, unsweet tea Exercise: Walking  Review of Systems  Past Medical History:  Diagnosis Date  . Depression     Current Outpatient Medications  Medication Sig Dispense Refill  . escitalopram (LEXAPRO) 20 MG tablet TAKE 1 TABLET BY MOUTH EVERY DAY 90 tablet 0   Current Facility-Administered Medications  Medication Dose Route Frequency Provider Last Rate Last Dose  . triamcinolone acetonide (KENALOG) 10 MG/ML injection 10 mg  10 mg Other Once Asencion Islam, DPM      . triamcinolone acetonide (KENALOG) 10 MG/ML injection 10 mg  10 mg Other Once Asencion Islam, DPM        Allergies  Allergen Reactions  . Hydrocodone Itching    Family History  Problem Relation Age of Onset  . Hypertension Brother   . Stroke Maternal Uncle   . Diabetes Maternal Uncle   . Cancer Maternal Grandmother        ovarin  . Heart disease Paternal Grandfather     Social History   Socioeconomic History  . Marital status: Single    Spouse name: Not on file  . Number of children: Not on file  . Years of education: Not on file  . Highest education level: Not on file  Occupational History  . Not on file  Social Needs  . Financial resource strain: Not on file  . Food insecurity:    Worry: Not on file    Inability: Not on file  . Transportation needs:    Medical: Not on file    Non-medical: Not on file  Tobacco Use  . Smoking status: Former Games developer  . Smokeless tobacco: Never Used  . Tobacco comment: quit 12 years ago  Substance and Sexual  Activity  . Alcohol use: Yes    Comment: rare  . Drug use: No  . Sexual activity: Never  Lifestyle  . Physical activity:    Days per week: Not on file    Minutes per session: Not on file  . Stress: Not on file  Relationships  . Social connections:    Talks on phone: Not on file    Gets together: Not on file    Attends religious service: Not on file    Active member of club or organization: Not on file    Attends meetings of clubs or organizations: Not on file    Relationship status: Not on file  . Intimate partner violence:    Fear of current or ex partner: Not on file    Emotionally abused: Not on file    Physically abused: Not on file    Forced sexual activity: Not on file  Other Topics Concern  . Not on file  Social History Narrative  . Not on file     Constitutional: Denies fever, malaise, fatigue, headache or abrupt weight changes.  HEENT: Denies eye pain, eye redness, ear pain, ringing in the ears, wax buildup, runny nose, nasal congestion, bloody nose, or sore throat. Respiratory: Denies difficulty breathing, shortness of breath, cough or sputum  production.   Cardiovascular: Denies chest pain, chest tightness, palpitations or swelling in the hands or feet.  Gastrointestinal: Denies abdominal pain, bloating, constipation, diarrhea or blood in the stool.  GU: Denies urgency, frequency, pain with urination, burning sensation, blood in urine, odor or discharge. Musculoskeletal: Denies decrease in range of motion, difficulty with gait, muscle pain or joint pain and swelling.  Skin: Denies redness, rashes, lesions or ulcercations.  Neurological: Denies dizziness, difficulty with memory, difficulty with speech or problems with balance and coordination.  Psych: Pt reports moodiness, irritability, depression. Denies anxiety, SI/HI.  No other specific complaints in a complete review of systems (except as listed in HPI above).     Objective:   Physical Exam   BP 116/78    Pulse 71   Temp 98.5 F (36.9 C) (Oral)   Ht 5\' 4"  (1.626 m)   Wt 172 lb (78 kg)   LMP 10/01/2017   SpO2 98%   BMI 29.52 kg/m  Wt Readings from Last 3 Encounters:  10/22/17 172 lb (78 kg)  09/19/16 174 lb (78.9 kg)  02/23/15 174 lb (78.9 kg)    General: Appears her stated age, well developed, well nourished in NAD. Skin: Warm, dry and intact.  HEENT: Head: normal shape and size; Eyes: sclera white, no icterus, conjunctiva pink, PERRLA and EOMs intact; Ears: Tm's gray and intact, normal light reflex; Throat/Mouth: Teeth present, mucosa pink and moist, no exudate, lesions or ulcerations noted.  Neck:  Neck supple, trachea midline. No masses, lumps or thyromegaly present.  Cardiovascular: Normal rate and rhythm. S1,S2 noted.  No murmur, rubs or gallops noted. No JVD or BLE edema.  Pulmonary/Chest: Normal effort and positive vesicular breath sounds. No respiratory distress. No wheezes, rales or ronchi noted.  Abdomen: Soft and nontender. Normal bowel sounds. No distention or masses noted. Liver, spleen and kidneys non palpable. Musculoskeletal: Strength 5/5 BUE/BLE. No difficulty with gait.  Neurological: Alert and oriented. Cranial nerves II-XII grossly intact. Coordination normal.  Psychiatric: Mood and affect normal. Behavior is normal. Judgment and thought content normal.     BMET    Component Value Date/Time   NA 141 09/19/2016 1611   NA 140 09/14/2015 1454   K 3.6 09/19/2016 1611   CL 104 09/19/2016 1611   CO2 25 09/19/2016 1611   GLUCOSE 135 (H) 09/19/2016 1611   BUN 9 09/19/2016 1611   BUN 11 09/14/2015 1454   CREATININE 0.82 09/19/2016 1611   CALCIUM 9.4 09/19/2016 1611   GFRNONAA 104 09/14/2015 1454   GFRAA 119 09/14/2015 1454    Lipid Panel     Component Value Date/Time   CHOL 210 (H) 09/19/2016 1611   TRIG 97 09/19/2016 1611   HDL 65 09/19/2016 1611   CHOLHDL 3.2 09/19/2016 1611   VLDL 19 09/19/2016 1611   LDLCALC 126 (H) 09/19/2016 1611    CBC      Component Value Date/Time   WBC 7.6 09/19/2016 1611   RBC 4.76 09/19/2016 1611   HGB 13.9 09/19/2016 1611   HGB 14.4 09/14/2015 1454   HCT 41.6 09/19/2016 1611   HCT 42.7 09/14/2015 1454   PLT 363 09/19/2016 1611   PLT 328 09/14/2015 1454   MCV 87.4 09/19/2016 1611   MCV 86 09/14/2015 1454   MCH 29.2 09/19/2016 1611   MCHC 33.4 09/19/2016 1611   RDW 12.7 09/19/2016 1611   RDW 12.9 09/14/2015 1454   LYMPHSABS 2.4 09/14/2015 1454   EOSABS 0.2 09/14/2015 1454   BASOSABS 0.0  09/14/2015 1454    Hgb A1C Lab Results  Component Value Date   HGBA1C 4.8 09/19/2016           Assessment & Plan:   Preventative Health Maintenance:  She declines flu shot today Tetanus UTD per her report Pap smear UTD Encouraged her to consume a balanced diet and exercise regimen Advised her to see a dentist annually Will check CBC, CMET, Lipid and A1C today  PMS:  Will trial Junel, eRx sent to pharmacy Advised to use a backup method to prevent pregnancy in the first 2 weeks on the pill and while on abx Advised her this does not protect against STD's  RTC in 1 year, sooner if needed Nicki Reaper, NP

## 2017-10-22 NOTE — Patient Instructions (Signed)

## 2017-10-22 NOTE — Assessment & Plan Note (Signed)
Stable on Lexapro Will monitor 

## 2017-12-20 ENCOUNTER — Other Ambulatory Visit: Payer: Self-pay | Admitting: Internal Medicine

## 2018-06-03 DIAGNOSIS — Z20828 Contact with and (suspected) exposure to other viral communicable diseases: Secondary | ICD-10-CM | POA: Diagnosis not present

## 2018-09-05 ENCOUNTER — Other Ambulatory Visit: Payer: Self-pay | Admitting: Internal Medicine

## 2018-10-28 ENCOUNTER — Other Ambulatory Visit: Payer: Self-pay

## 2018-10-28 ENCOUNTER — Encounter: Payer: Self-pay | Admitting: Internal Medicine

## 2018-10-28 ENCOUNTER — Ambulatory Visit (INDEPENDENT_AMBULATORY_CARE_PROVIDER_SITE_OTHER): Payer: BLUE CROSS/BLUE SHIELD | Admitting: Internal Medicine

## 2018-10-28 VITALS — BP 116/74 | HR 70 | Temp 98.6°F | Ht 64.0 in | Wt 180.0 lb

## 2018-10-28 DIAGNOSIS — Z114 Encounter for screening for human immunodeficiency virus [HIV]: Secondary | ICD-10-CM | POA: Diagnosis not present

## 2018-10-28 DIAGNOSIS — Z23 Encounter for immunization: Secondary | ICD-10-CM | POA: Diagnosis not present

## 2018-10-28 DIAGNOSIS — Z0000 Encounter for general adult medical examination without abnormal findings: Secondary | ICD-10-CM

## 2018-10-28 DIAGNOSIS — F329 Major depressive disorder, single episode, unspecified: Secondary | ICD-10-CM

## 2018-10-28 LAB — LIPID PANEL
Cholesterol: 193 mg/dL (ref 0–200)
HDL: 56.8 mg/dL (ref >39.00)
LDL Cholesterol: 113 mg/dL — ABNORMAL HIGH (ref 0–99)
NonHDL: 136.47
Total CHOL/HDL Ratio: 3
Triglycerides: 116 mg/dL (ref 0.0–149.0)
VLDL: 23.2 mg/dL (ref 0.0–40.0)

## 2018-10-28 LAB — COMPREHENSIVE METABOLIC PANEL
ALT: 11 U/L (ref 0–35)
AST: 8 U/L (ref 0–37)
Albumin: 4.3 g/dL (ref 3.5–5.2)
Alkaline Phosphatase: 62 U/L (ref 39–117)
BUN: 9 mg/dL (ref 6–23)
CO2: 30 mEq/L (ref 19–32)
Calcium: 9.6 mg/dL (ref 8.4–10.5)
Chloride: 105 mEq/L (ref 96–112)
Creatinine, Ser: 0.71 mg/dL (ref 0.40–1.20)
GFR: 91.77 mL/min (ref >60.00)
Glucose, Bld: 99 mg/dL (ref 70–99)
Potassium: 4.4 mEq/L (ref 3.5–5.1)
Sodium: 141 mEq/L (ref 135–145)
Total Bilirubin: 0.5 mg/dL (ref 0.2–1.2)
Total Protein: 6.8 g/dL (ref 6.0–8.3)

## 2018-10-28 NOTE — Patient Instructions (Signed)
Health Maintenance, Female Adopting a healthy lifestyle and getting preventive care are important in promoting health and wellness. Ask your health care provider about:  The right schedule for you to have regular tests and exams.  Things you can do on your own to prevent diseases and keep yourself healthy. What should I know about diet, weight, and exercise? Eat a healthy diet   Eat a diet that includes plenty of vegetables, fruits, low-fat dairy products, and lean protein.  Do not eat a lot of foods that are high in solid fats, added sugars, or sodium. Maintain a healthy weight Body mass index (BMI) is used to identify weight problems. It estimates body fat based on height and weight. Your health care provider can help determine your BMI and help you achieve or maintain a healthy weight. Get regular exercise Get regular exercise. This is one of the most important things you can do for your health. Most adults should:  Exercise for at least 150 minutes each week. The exercise should increase your heart rate and make you sweat (moderate-intensity exercise).  Do strengthening exercises at least twice a week. This is in addition to the moderate-intensity exercise.  Spend less time sitting. Even light physical activity can be beneficial. Watch cholesterol and blood lipids Have your blood tested for lipids and cholesterol at 38 years of age, then have this test every 5 years. Have your cholesterol levels checked more often if:  Your lipid or cholesterol levels are high.  You are older than 38 years of age.  You are at high risk for heart disease. What should I know about cancer screening? Depending on your health history and family history, you may need to have cancer screening at various ages. This may include screening for:  Breast cancer.  Cervical cancer.  Colorectal cancer.  Skin cancer.  Lung cancer. What should I know about heart disease, diabetes, and high blood  pressure? Blood pressure and heart disease  High blood pressure causes heart disease and increases the risk of stroke. This is more likely to develop in people who have high blood pressure readings, are of African descent, or are overweight.  Have your blood pressure checked: ? Every 3-5 years if you are 18-39 years of age. ? Every year if you are 40 years old or older. Diabetes Have regular diabetes screenings. This checks your fasting blood sugar level. Have the screening done:  Once every three years after age 40 if you are at a normal weight and have a low risk for diabetes.  More often and at a younger age if you are overweight or have a high risk for diabetes. What should I know about preventing infection? Hepatitis B If you have a higher risk for hepatitis B, you should be screened for this virus. Talk with your health care provider to find out if you are at risk for hepatitis B infection. Hepatitis C Testing is recommended for:  Everyone born from 1945 through 1965.  Anyone with known risk factors for hepatitis C. Sexually transmitted infections (STIs)  Get screened for STIs, including gonorrhea and chlamydia, if: ? You are sexually active and are younger than 38 years of age. ? You are older than 38 years of age and your health care provider tells you that you are at risk for this type of infection. ? Your sexual activity has changed since you were last screened, and you are at increased risk for chlamydia or gonorrhea. Ask your health care provider if   you are at risk.  Ask your health care provider about whether you are at high risk for HIV. Your health care provider may recommend a prescription medicine to help prevent HIV infection. If you choose to take medicine to prevent HIV, you should first get tested for HIV. You should then be tested every 3 months for as long as you are taking the medicine. Pregnancy  If you are about to stop having your period (premenopausal) and  you may become pregnant, seek counseling before you get pregnant.  Take 400 to 800 micrograms (mcg) of folic acid every day if you become pregnant.  Ask for birth control (contraception) if you want to prevent pregnancy. Osteoporosis and menopause Osteoporosis is a disease in which the bones lose minerals and strength with aging. This can result in bone fractures. If you are 65 years old or older, or if you are at risk for osteoporosis and fractures, ask your health care provider if you should:  Be screened for bone loss.  Take a calcium or vitamin D supplement to lower your risk of fractures.  Be given hormone replacement therapy (HRT) to treat symptoms of menopause. Follow these instructions at home: Lifestyle  Do not use any products that contain nicotine or tobacco, such as cigarettes, e-cigarettes, and chewing tobacco. If you need help quitting, ask your health care provider.  Do not use street drugs.  Do not share needles.  Ask your health care provider for help if you need support or information about quitting drugs. Alcohol use  Do not drink alcohol if: ? Your health care provider tells you not to drink. ? You are pregnant, may be pregnant, or are planning to become pregnant.  If you drink alcohol: ? Limit how much you use to 0-1 drink a day. ? Limit intake if you are breastfeeding.  Be aware of how much alcohol is in your drink. In the [place] one drink equals one 12 oz bottle of beer (355 mL), one 5 oz glass of wine (148 mL), or one 1 oz glass of hard liquor (44 mL). General instructions  Schedule regular health, dental, and eye exams.  Stay current with your vaccines.  Tell your health care provider if: ? You often feel depressed. ? You have ever been abused or do not feel safe at home. Summary  Adopting a healthy lifestyle and getting preventive care are important in promoting health and wellness.  Follow your health care provider's instructions about healthy  diet, exercising, and getting tested or screened for diseases.  Follow your health care provider's instructions on monitoring your cholesterol and blood pressure. This information is not intended to replace advice given to you by your health care provider. Make sure you discuss any questions you have with your health care provider. Document Released: 07/22/2010 Document Revised: 12/30/2017 Document Reviewed: 12/30/2017 [employer] Patient Education  2020 [employer]  

## 2018-10-28 NOTE — Progress Notes (Signed)
Subjective:    Patient ID: Kristina Hancock, female    DOB: 04-27-1980, 38 y.o.   MRN: [id number]  HPI  Pt presents to the clinic today for her annual exam.  Depression: Chronic but well managed on Escitalopram. She is not currently seeing a therapist. She denise SI/HI.  Flu: never Tetanus:? 2014 Pap Smear: 02/2015 Dentist: biannually  Diet: Eats meat, fruits and veggies, some friedfoods. Drinks water, sweet tea, coffee. Exercise: Walking 20 minutes daily.  Review of Systems      Past Medical History:  Diagnosis Date  . Depression     Current Outpatient Medications  Medication Sig Dispense Refill  . escitalopram (LEXAPRO) 20 MG tablet TAKE 1 TABLET BY MOUTH EVERY DAY 90 tablet 0  . JUNEL FE 1/20 1-20 MG-MCG tablet TAKE 1 TABLET BY MOUTH EVERY DAY 84 tablet 0  . Nutritional Supplements (JUICE PLUS FIBRE PO) Take by mouth.     No current facility-administered medications for this visit.     Allergies  Allergen Reactions  . Hydrocodone Itching    Family History  Problem Relation Age of Onset  . Hypertension Brother   . Stroke Maternal Uncle   . Diabetes Maternal Uncle   . Cancer Maternal Grandmother        ovarin  . Heart disease Paternal Grandfather     Social History   Socioeconomic History  . Marital status: Single    Spouse name: Not on file  . Number of children: Not on file  . Years of education: Not on file  . Highest education level: Not on file  Occupational History  . Not on file  Social Needs  . Financial resource strain: Not on file  . Food insecurity    Worry: Not on file    Inability: Not on file  . Transportation needs    Medical: Not on file    Non-medical: Not on file  Tobacco Use  . Smoking status: Former Research scientist (life sciences)  . Smokeless tobacco: Never Used  . Tobacco comment: quit 12 years ago  Substance and Sexual Activity  . Alcohol use: Yes    Comment: rare  . Drug use: No  . Sexual activity: Never  Lifestyle  . Physical activity   Days per week: Not on file    Minutes per session: Not on file  . Stress: Not on file  Relationships  . Social Herbalist on phone: Not on file    Gets together: Not on file    Attends religious service: Not on file    Active member of club or organization: Not on file    Attends meetings of clubs or organizations: Not on file    Relationship status: Not on file  . Intimate partner violence    Fear of current or ex partner: Not on file    Emotionally abused: Not on file    Physically abused: Not on file    Forced sexual activity: Not on file  Other Topics Concern  . Not on file  Social History Narrative  . Not on file     Constitutional: Denies fever, malaise, fatigue, headache or abrupt weight changes.  HEENT: Denies eye pain, eye redness, ear pain, ringing in the ears, wax buildup, runny nose, nasal congestion, bloody nose, or sore throat. Respiratory: Denies difficulty breathing, shortness of breath, cough or sputum production.   Cardiovascular: Denies chest pain, chest tightness, palpitations or swelling in the hands or feet.  Gastrointestinal: Denies abdominal pain,  bloating, constipation, diarrhea or blood in the stool.  GU: Denies urgency, frequency, pain with urination, burning sensation, blood in urine, odor or discharge. Musculoskeletal: Denies decrease in range of motion, difficulty with gait, muscle pain or joint pain and swelling.  Skin: Denies redness, rashes, lesions or ulcercations.  Neurological: Denies dizziness, difficulty with memory, difficulty with speech or problems with balance and coordination.  Psych: Pt has a history of anxiety. Denies depression, SI/HI.  No other specific complaints in a complete review of systems (except as listed in HPI above).  Objective:   Physical Exam  BP 116/74   Pulse 70   Temp 98.6 F (37 C) (Temporal)   Ht 5\' 4"  (1.626 m)   Wt 180 lb (81.6 kg)   LMP 10/13/2018   SpO2 98%   BMI 30.90 kg/m    Wt Readings  from Last 3 Encounters:  10/28/18 180 lb (81.6 kg)  10/22/17 172 lb (78 kg)  09/19/16 174 lb (78.9 kg)    General: Appears her stated age, obese,  in NAD. Skin: Warm, dry and intact. No rashes noted. HEENT: Head: normal shape and size; Eyes: sclera white, no icterus, conjunctiva pink, PERRLA and EOMs intact; Ears: Tm's gray and intact, normal light reflex; Neck:  Neck supple, trachea midline. No masses, lumps or thyromegaly present.  Cardiovascular: Normal rate and rhythm. S1,S2 noted.  No murmur, rubs or gallops noted. No JVD or BLE edema. Pulmonary/Chest: Normal effort and positive vesicular breath sounds. No respiratory distress. No wheezes, rales or ronchi noted.  Abdomen: Soft and nontender. Normal bowel sounds. No distention or masses noted. Liver, spleen and kidneys non palpable. Musculoskeletal: Strength 5/5 BUE/BLE. No difficulty with gait.  Neurological: Alert and oriented. Cranial nerves II-XII grossly intact. Coordination normal.  Psychiatric: Mood and affect normal. Behavior is normal. Judgment and thought content normal.     BMET    Component Value Date/Time   NA 139 10/22/2017 0840   NA 140 09/14/2015 1454   K 4.1 10/22/2017 0840   CL 102 10/22/2017 0840   CO2 33 (H) 10/22/2017 0840   GLUCOSE 90 10/22/2017 0840   BUN 11 10/22/2017 0840   BUN 11 09/14/2015 1454   CREATININE 0.74 10/22/2017 0840   CREATININE 0.82 09/19/2016 1611   CALCIUM 9.5 10/22/2017 0840   GFRNONAA 104 09/14/2015 1454   GFRAA 119 09/14/2015 1454    Lipid Panel     Component Value Date/Time   CHOL 185 10/22/2017 0840   TRIG 100.0 10/22/2017 0840   HDL 57.10 10/22/2017 0840   CHOLHDL 3 10/22/2017 0840   VLDL 20.0 10/22/2017 0840   LDLCALC 107 (H) 10/22/2017 0840    CBC    Component Value Date/Time   WBC 5.7 10/22/2017 0840   RBC 4.92 10/22/2017 0840   HGB 14.6 10/22/2017 0840   HGB 14.4 09/14/2015 1454   HCT 43.0 10/22/2017 0840   HCT 42.7 09/14/2015 1454   PLT 289.0 10/22/2017  0840   PLT 328 09/14/2015 1454   MCV 87.3 10/22/2017 0840   MCV 86 09/14/2015 1454   MCH 29.2 09/19/2016 1611   MCHC 33.9 10/22/2017 0840   RDW 12.9 10/22/2017 0840   RDW 12.9 09/14/2015 1454   LYMPHSABS 2.4 09/14/2015 1454   EOSABS 0.2 09/14/2015 1454   BASOSABS 0.0 09/14/2015 1454    Hgb A1C Lab Results  Component Value Date   HGBA1C 5.2 10/22/2017           Assessment & Plan:   Preventative  Health Maintenance:  Flu shot today Tetanus UTD per her report She declines pap smear today Encouraged her to consume a balanced diet and exercise regimen Advised her to see an eye doctor or dentist annually Will check CBC, CMET, Lipid profile today  RTC in 1 year, sooner if needed Nicki Reaperegina Baity, NP

## 2018-10-28 NOTE — Assessment & Plan Note (Signed)
Stable on Escitalopram Support offered today Will monitor

## 2018-10-29 LAB — CBC
HCT: 43.5 % (ref 36.0–46.0)
Hemoglobin: 14.8 g/dL (ref 12.0–15.0)
MCHC: 33.9 g/dL (ref 30.0–36.0)
MCV: 88.9 fl (ref 78.0–100.0)
Platelets: 340 10*3/uL (ref 150.0–400.0)
RBC: 4.9 Mil/uL (ref 3.87–5.11)
RDW: 13.1 % (ref 11.5–15.5)
WBC: 5 10*3/uL (ref 4.0–10.5)

## 2018-10-29 LAB — HIV ANTIBODY (ROUTINE TESTING W REFLEX): HIV 1&2 Ab, 4th Generation: NONREACTIVE

## 2018-11-17 ENCOUNTER — Other Ambulatory Visit: Payer: Self-pay | Admitting: Internal Medicine

## 2018-11-18 MED ORDER — NORETHIN ACE-ETH ESTRAD-FE 1-20 MG-MCG PO TABS: 1.0000 | ORAL_TABLET | Freq: Every day | ORAL | 2 refills | Status: DC

## 2018-11-18 MED ORDER — ESCITALOPRAM OXALATE 20 MG PO TABS: 20.0000 mg | ORAL_TABLET | Freq: Every day | ORAL | 2 refills | Status: DC

## 2018-11-18 NOTE — Addendum Note (Signed)
Addended by: Lurlean Nanny on: 11/18/2018 05:14 PM   Modules accepted: Orders

## 2019-02-10 DIAGNOSIS — Z20828 Contact with and (suspected) exposure to other viral communicable diseases: Secondary | ICD-10-CM | POA: Diagnosis not present

## 2019-07-16 ENCOUNTER — Other Ambulatory Visit: Payer: Self-pay | Admitting: Internal Medicine

## 2019-08-05 ENCOUNTER — Other Ambulatory Visit: Payer: Self-pay | Admitting: Internal Medicine

## 2019-09-26 ENCOUNTER — Telehealth: Payer: BLUE CROSS/BLUE SHIELD | Admitting: Nurse Practitioner

## 2019-09-26 DIAGNOSIS — W57XXXA Bitten or stung by nonvenomous insect and other nonvenomous arthropods, initial encounter: Secondary | ICD-10-CM

## 2019-09-26 DIAGNOSIS — S70362A Insect bite (nonvenomous), left thigh, initial encounter: Secondary | ICD-10-CM | POA: Diagnosis not present

## 2019-09-26 MED ORDER — CEPHALEXIN 500 MG PO CAPS: 500.0000 mg | ORAL_CAPSULE | Freq: Three times a day (TID) | ORAL | 0 refills | Status: DC

## 2019-09-26 NOTE — Addendum Note (Signed)
Addended by: Bennie Pierini on: 09/26/2019 02:42 PM   Modules accepted: Orders

## 2019-09-26 NOTE — Progress Notes (Signed)
E Visit for Rash  We are sorry that you are not feeling well. Here is how we plan to help!   Review your responses and you pictures, this looks like bug bites. Try to avoid scratching or rubbing. If they change in appeareance lets [place] know.    HOME CARE:   Take cool showers and avoid direct sunlight.  Apply cool compress or wet dressings.  Take a bath in an oatmeal bath.  Sprinkle content of one Aveeno packet under running faucet with comfortably warm water.  Bathe for 15-20 minutes, 1-2 times daily.  Pat dry with a towel. Do not rub the rash.  Use hydrocortisone cream.  Take an antihistamine like Benadryl for widespread rashes that itch.  The adult dose of Benadryl is 25-50 mg by mouth 4 times daily.  Caution:  This type of medication may cause sleepiness.  Do not drink alcohol, drive, or operate dangerous machinery while taking antihistamines.  Do not take these medications if you have prostate enlargement.  Read package instructions thoroughly on all medications that you take.  GET HELP RIGHT AWAY IF:   Symptoms don't go away after treatment.  Severe itching that persists.  If you rash spreads or swells.  If you rash begins to smell.  If it blisters and opens or develops a yellow-brown crust.  You develop a fever.  You have a sore throat.  You become short of breath.  MAKE SURE YOU:  Understand these instructions. Will watch your condition. Will get help right away if you are not doing well or get worse.  Thank you for choosing an e-visit. Your e-visit answers were reviewed by a board certified advanced clinical practitioner to complete your personal care plan. Depending upon the condition, your plan could have included both over the counter or prescription medications. Please review your pharmacy choice. Be sure that the pharmacy you have chosen is open so that you can pick up your prescription now.  If there is a problem you may message your provider in MyChart  to have the prescription routed to another pharmacy. Your safety is important to [place]. If you have drug allergies check your prescription carefully.  For the next 24 hours, you can use MyChart to ask questions about today's visit, request a non-urgent call back, or ask for a work or school excuse from your e-visit provider. You will get an email in the next two days asking about your experience. I hope that your e-visit has been valuable and will speed your recovery.   5-10 minutes spent reviewing and documenting in chart.

## 2019-09-27 DIAGNOSIS — L03116 Cellulitis of left lower limb: Secondary | ICD-10-CM | POA: Diagnosis not present

## 2019-10-28 ENCOUNTER — Other Ambulatory Visit: Payer: Self-pay | Admitting: Internal Medicine

## 2019-11-03 ENCOUNTER — Other Ambulatory Visit: Payer: Self-pay | Admitting: Internal Medicine

## 2019-11-03 ENCOUNTER — Encounter: Payer: BLUE CROSS/BLUE SHIELD | Admitting: Internal Medicine

## 2020-01-08 ENCOUNTER — Encounter: Payer: Self-pay | Admitting: Internal Medicine

## 2020-01-12 ENCOUNTER — Ambulatory Visit (INDEPENDENT_AMBULATORY_CARE_PROVIDER_SITE_OTHER): Payer: BLUE CROSS/BLUE SHIELD | Admitting: Internal Medicine

## 2020-01-12 ENCOUNTER — Other Ambulatory Visit (HOSPITAL_COMMUNITY)
Admission: RE | Admit: 2020-01-12 | Discharge: 2020-01-12 | Disposition: A | Discharge status: Home / Self Care | Payer: BLUE CROSS/BLUE SHIELD | Source: Ambulatory Visit | Attending: Internal Medicine | Admitting: Internal Medicine

## 2020-01-12 ENCOUNTER — Encounter: Payer: Self-pay | Admitting: Internal Medicine

## 2020-01-12 ENCOUNTER — Other Ambulatory Visit: Payer: Self-pay

## 2020-01-12 VITALS — BP 100/68 | HR 73 | Temp 97.5°F | Ht 64.0 in | Wt 167.0 lb

## 2020-01-12 DIAGNOSIS — Z0000 Encounter for general adult medical examination without abnormal findings: Secondary | ICD-10-CM | POA: Diagnosis not present

## 2020-01-12 DIAGNOSIS — F329 Major depressive disorder, single episode, unspecified: Secondary | ICD-10-CM | POA: Diagnosis not present

## 2020-01-12 DIAGNOSIS — Z1159 Encounter for screening for other viral diseases: Secondary | ICD-10-CM

## 2020-01-12 LAB — CBC
HCT: 43.7 % (ref 36.0–46.0)
Hemoglobin: 14.9 g/dL (ref 12.0–15.0)
MCHC: 34 g/dL (ref 30.0–36.0)
MCV: 88.4 fl (ref 78.0–100.0)
Platelets: 306 10*3/uL (ref 150.0–400.0)
RBC: 4.94 Mil/uL (ref 3.87–5.11)
RDW: 13 % (ref 11.5–15.5)
WBC: 5.2 10*3/uL (ref 4.0–10.5)

## 2020-01-12 LAB — LIPID PANEL
Cholesterol: 185 mg/dL (ref 0–200)
HDL: 67.6 mg/dL (ref >39.00)
LDL Cholesterol: 81 mg/dL (ref 0–99)
NonHDL: 117.19
Total CHOL/HDL Ratio: 3
Triglycerides: 182 mg/dL — ABNORMAL HIGH (ref 0.0–149.0)
VLDL: 36.4 mg/dL (ref 0.0–40.0)

## 2020-01-12 LAB — COMPREHENSIVE METABOLIC PANEL
ALT: 12 U/L (ref 0–35)
AST: 10 U/L (ref 0–37)
Albumin: 4.2 g/dL (ref 3.5–5.2)
Alkaline Phosphatase: 65 U/L (ref 39–117)
BUN: 10 mg/dL (ref 6–23)
CO2: 31 mEq/L (ref 19–32)
Calcium: 9.3 mg/dL (ref 8.4–10.5)
Chloride: 103 mEq/L (ref 96–112)
Creatinine, Ser: 0.82 mg/dL (ref 0.40–1.20)
GFR: 89.77 mL/min (ref >60.00)
Glucose, Bld: 90 mg/dL (ref 70–99)
Potassium: 4.1 mEq/L (ref 3.5–5.1)
Sodium: 138 mEq/L (ref 135–145)
Total Bilirubin: 0.5 mg/dL (ref 0.2–1.2)
Total Protein: 7 g/dL (ref 6.0–8.3)

## 2020-01-12 MED ORDER — NORETHIN ACE-ETH ESTRAD-FE 1-20 MG-MCG PO TABS: 1.0000 | ORAL_TABLET | Freq: Every day | ORAL | 3 refills | Status: DC

## 2020-01-12 MED ORDER — ESCITALOPRAM OXALATE 20 MG PO TABS: 20.0000 mg | ORAL_TABLET | Freq: Every day | ORAL | 3 refills | Status: DC

## 2020-01-12 NOTE — Progress Notes (Signed)
Subjective:    Patient ID: Kristina Hancock, female    DOB: [birth year], 39 y.o.   MRN: [id number]  HPI  Pt presents to the clinic today for her annual exam.  Depression: Persistent, managed on Escitalopram. She is not currently seeing a therapist. She denies anxiety, SI/HI.  Flu: 10/2018 Tetanus: 02/2012 Covid: Pfizer Pap Smear: 02/2015 Dentist: biannually  Diet: She does eat meat. She consumes fruits and veggies daily. She does eat some fried foods. She drinks mostly water, coffee and tea. Exercise: Running with her dog  Review of Systems  Past Medical History:  Diagnosis Date  . Depression     Current Outpatient Medications  Medication Sig Dispense Refill  . JUNEL FE 1/20 1-20 MG-MCG tablet TAKE 1 TABLET BY MOUTH EVERY DAY 84 tablet 0  . cephALEXin (KEFLEX) 500 MG capsule Take 1 capsule (500 mg total) by mouth 3 (three) times daily. 21 capsule 0  . escitalopram (LEXAPRO) 20 MG tablet TAKE 1 TABLET BY MOUTH EVERY DAY 90 tablet 0  . Nutritional Supplements (JUICE PLUS FIBRE PO) Take by mouth.     No current facility-administered medications for this visit.    Allergies  Allergen Reactions  . Hydrocodone Itching    Family History  Problem Relation Age of Onset  . Hypertension Brother   . Stroke Maternal Uncle   . Diabetes Maternal Uncle   . Cancer Maternal Grandmother        ovarin  . Heart disease Paternal Grandfather     Social History   Socioeconomic History  . Marital status: Single    Spouse name: Not on file  . Number of children: Not on file  . Years of education: Not on file  . Highest education level: Not on file  Occupational History  . Not on file  Tobacco Use  . Smoking status: Former Games developer  . Smokeless tobacco: Never Used  . Tobacco comment: quit 12 years ago  Substance and Sexual Activity  . Alcohol use: Yes    Comment: rare  . Drug use: No  . Sexual activity: Never  Other Topics Concern  . Not on file  Social History Narrative  .  Not on file   Social Determinants of Health   Financial Resource Strain: Not on file  Food Insecurity: Not on file  Transportation Needs: Not on file  Physical Activity: Not on file  Stress: Not on file  Social Connections: Not on file  Intimate Partner Violence: Not on file     Constitutional: Denies fever, malaise, fatigue, headache or abrupt weight changes.  HEENT: Denies eye pain, eye redness, ear pain, ringing in the ears, wax buildup, runny nose, nasal congestion, bloody nose, or sore throat. Respiratory: Denies difficulty breathing, shortness of breath, cough or sputum production.   Cardiovascular: Denies chest pain, chest tightness, palpitations or swelling in the hands or feet.  Gastrointestinal: Denies abdominal pain, bloating, constipation, diarrhea or blood in the stool.  GU: Denies urgency, frequency, pain with urination, burning sensation, blood in urine, odor or discharge. Musculoskeletal: Denies decrease in range of motion, difficulty with gait, muscle pain or joint pain and swelling.  Skin: Denies redness, rashes, lesions or ulcercations.  Neurological: Denies dizziness, difficulty with memory, difficulty with speech or problems with balance and coordination.  Psych: Pt has a history of depression. Denies anxiety, SI/HI.  No other specific complaints in a complete review of systems (except as listed in HPI above).     Objective:   Physical Exam  BP 100/68 (BP Location: Left Arm, Patient Position: Sitting, Cuff Size: Large)   Pulse 73   Temp (!) 97.5 F (36.4 C)   Ht 5\' 4"  (1.626 m)   Wt 167 lb (75.8 kg)   SpO2 98%   BMI 28.67 kg/m   Wt Readings from Last 3 Encounters:  10/28/18 180 lb (81.6 kg)  10/22/17 172 lb (78 kg)  09/19/16 174 lb (78.9 kg)    General: Appears her stated age, well developed, well nourished in NAD. Skin: Warm, dry and intact. No rashes, lesions or ulcerations noted. HEENT: Head: normal shape and size; Eyes: sclera white, no  icterus, conjunctiva pink, PERRLA and EOMs intact;  Neck:  Neck supple, trachea midline. No masses, lumps or thyromegaly present.  Cardiovascular: Normal rate and rhythm. S1,S2 noted.  No murmur, rubs or gallops noted. No JVD or BLE edema. No carotid bruits noted. Pulmonary/Chest: Normal effort and positive vesicular breath sounds. No respiratory distress. No wheezes, rales or ronchi noted.  Abdomen: Soft and nontender. Normal bowel sounds. No distention or masses noted. Liver, spleen and kidneys non palpable. Pelvic: Normal female anatomy. Cervix irritated, blood oozing from os. No CMT. Adnexa nonpalpable. Musculoskeletal: Strength 5/5 BUE/BLE. No difficulty with gait.  Neurological: Alert and oriented. Cranial nerves II-XII grossly intact. Coordination normal.  Psychiatric: Mood and affect normal. Behavior is normal. Judgment and thought content normal.     BMET    Component Value Date/Time   NA 141 10/28/2018 0852   NA 140 09/14/2015 1454   K 4.4 10/28/2018 0852   CL 105 10/28/2018 0852   CO2 30 10/28/2018 0852   GLUCOSE 99 10/28/2018 0852   BUN 9 10/28/2018 0852   BUN 11 09/14/2015 1454   CREATININE 0.71 10/28/2018 0852   CREATININE 0.82 09/19/2016 1611   CALCIUM 9.6 10/28/2018 0852   GFRNONAA 104 09/14/2015 1454   GFRAA 119 09/14/2015 1454    Lipid Panel     Component Value Date/Time   CHOL 193 10/28/2018 0852   TRIG 116.0 10/28/2018 0852   HDL 56.80 10/28/2018 0852   CHOLHDL 3 10/28/2018 0852   VLDL 23.2 10/28/2018 0852   LDLCALC 113 (H) 10/28/2018 0852    CBC    Component Value Date/Time   WBC 5.0 10/28/2018 0852   RBC 4.90 10/28/2018 0852   HGB 14.8 10/28/2018 0852   HGB 14.4 09/14/2015 1454   HCT 43.5 10/28/2018 0852   HCT 42.7 09/14/2015 1454   PLT 340.0 10/28/2018 0852   PLT 328 09/14/2015 1454   MCV 88.9 10/28/2018 0852   MCV 86 09/14/2015 1454   MCH 29.2 09/19/2016 1611   MCHC 33.9 10/28/2018 0852   RDW 13.1 10/28/2018 0852   RDW 12.9 09/14/2015  1454   LYMPHSABS 2.4 09/14/2015 1454   EOSABS 0.2 09/14/2015 1454   BASOSABS 0.0 09/14/2015 1454    Hgb A1C Lab Results  Component Value Date   HGBA1C 5.2 10/22/2017           Assessment & Plan:   Preventative Health Maintenance:  She declines flu shot today Tetanus UTD Covid UTD Pap smear UTD Encouraged her to consume a balanced diet and exercise regimen Advised her to see an eye doctor and dentist annually Will check CBC, CMET, Lipid and Hep C today  RTC in 1 year, sooner if needed 12/22/2017, NP This visit occurred during the SARS-CoV-2 public health emergency.  Safety protocols were in place, including screening questions prior to the visit, additional usage of  staff PPE, and extensive cleaning of exam room while observing appropriate contact time as indicated for disinfecting solutions.

## 2020-01-12 NOTE — Assessment & Plan Note (Signed)
Stable on Escitalopram, refilled today

## 2020-01-12 NOTE — Patient Instructions (Signed)
Health Maintenance, Female Adopting a healthy lifestyle and getting preventive care are important in promoting health and wellness. Ask your health care provider about:  The right schedule for you to have regular tests and exams.  Things you can do on your own to prevent diseases and keep yourself healthy. What should I know about diet, weight, and exercise? Eat a healthy diet   Eat a diet that includes plenty of vegetables, fruits, low-fat dairy products, and lean protein.  Do not eat a lot of foods that are high in solid fats, added sugars, or sodium. Maintain a healthy weight Body mass index (BMI) is used to identify weight problems. It estimates body fat based on height and weight. Your health care provider can help determine your BMI and help you achieve or maintain a healthy weight. Get regular exercise Get regular exercise. This is one of the most important things you can do for your health. Most adults should:  Exercise for at least 150 minutes each week. The exercise should increase your heart rate and make you sweat (moderate-intensity exercise).  Do strengthening exercises at least twice a week. This is in addition to the moderate-intensity exercise.  Spend less time sitting. Even light physical activity can be beneficial. Watch cholesterol and blood lipids Have your blood tested for lipids and cholesterol at 39 years of age, then have this test every 5 years. Have your cholesterol levels checked more often if:  Your lipid or cholesterol levels are high.  You are older than 40 years of age.  You are at high risk for heart disease. What should I know about cancer screening? Depending on your health history and family history, you may need to have cancer screening at various ages. This may include screening for:  Breast cancer.  Cervical cancer.  Colorectal cancer.  Skin cancer.  Lung cancer. What should I know about heart disease, diabetes, and high blood  pressure? Blood pressure and heart disease  High blood pressure causes heart disease and increases the risk of stroke. This is more likely to develop in people who have high blood pressure readings, are of African descent, or are overweight.  Have your blood pressure checked: ? Every 3-5 years if you are 18-39 years of age. ? Every year if you are 40 years old or older. Diabetes Have regular diabetes screenings. This checks your fasting blood sugar level. Have the screening done:  Once every three years after age 40 if you are at a normal weight and have a low risk for diabetes.  More often and at a younger age if you are overweight or have a high risk for diabetes. What should I know about preventing infection? Hepatitis B If you have a higher risk for hepatitis B, you should be screened for this virus. Talk with your health care provider to find out if you are at risk for hepatitis B infection. Hepatitis C Testing is recommended for:  Everyone born from 1945 through 1965.  Anyone with known risk factors for hepatitis C. Sexually transmitted infections (STIs)  Get screened for STIs, including gonorrhea and chlamydia, if: ? You are sexually active and are younger than 39 years of age. ? You are older than 39 years of age and your health care provider tells you that you are at risk for this type of infection. ? Your sexual activity has changed since you were last screened, and you are at increased risk for chlamydia or gonorrhea. Ask your health care provider if   you are at risk.  Ask your health care provider about whether you are at high risk for HIV. Your health care provider may recommend a prescription medicine to help prevent HIV infection. If you choose to take medicine to prevent HIV, you should first get tested for HIV. You should then be tested every 3 months for as long as you are taking the medicine. Pregnancy  If you are about to stop having your period (premenopausal) and  you may become pregnant, seek counseling before you get pregnant.  Take 400 to 800 micrograms (mcg) of folic acid every day if you become pregnant.  Ask for birth control (contraception) if you want to prevent pregnancy. Osteoporosis and menopause Osteoporosis is a disease in which the bones lose minerals and strength with aging. This can result in bone fractures. If you are 65 years old or older, or if you are at risk for osteoporosis and fractures, ask your health care provider if you should:  Be screened for bone loss.  Take a calcium or vitamin D supplement to lower your risk of fractures.  Be given hormone replacement therapy (HRT) to treat symptoms of menopause. Follow these instructions at home: Lifestyle  Do not use any products that contain nicotine or tobacco, such as cigarettes, e-cigarettes, and chewing tobacco. If you need help quitting, ask your health care provider.  Do not use street drugs.  Do not share needles.  Ask your health care provider for help if you need support or information about quitting drugs. Alcohol use  Do not drink alcohol if: ? Your health care provider tells you not to drink. ? You are pregnant, may be pregnant, or are planning to become pregnant.  If you drink alcohol: ? Limit how much you use to 0-1 drink a day. ? Limit intake if you are breastfeeding.  Be aware of how much alcohol is in your drink. In the [place] one drink equals one 12 oz bottle of beer (355 mL), one 5 oz glass of wine (148 mL), or one 1 oz glass of hard liquor (44 mL). General instructions  Schedule regular health, dental, and eye exams.  Stay current with your vaccines.  Tell your health care provider if: ? You often feel depressed. ? You have ever been abused or do not feel safe at home. Summary  Adopting a healthy lifestyle and getting preventive care are important in promoting health and wellness.  Follow your health care provider's instructions about healthy  diet, exercising, and getting tested or screened for diseases.  Follow your health care provider's instructions on monitoring your cholesterol and blood pressure. This information is not intended to replace advice given to you by your health care provider. Make sure you discuss any questions you have with your health care provider. Document Revised: 12/30/2017 Document Reviewed: 12/30/2017 Elsevier Patient Education  2020 [employer]  

## 2020-01-16 LAB — HEPATITIS C ANTIBODY
Hepatitis C Ab: NONREACTIVE
SIGNAL TO CUT-OFF: 0 (ref <1.00)

## 2020-01-16 LAB — CYTOLOGY - PAP
Comment: NEGATIVE
Diagnosis: NEGATIVE
High risk HPV: NEGATIVE

## 2020-10-29 ENCOUNTER — Ambulatory Visit: Payer: BLUE CROSS/BLUE SHIELD | Admitting: Internal Medicine

## 2020-11-22 ENCOUNTER — Ambulatory Visit: Payer: BLUE CROSS/BLUE SHIELD | Admitting: Internal Medicine

## 2020-11-24 ENCOUNTER — Other Ambulatory Visit: Payer: Self-pay | Admitting: Internal Medicine

## 2020-11-24 NOTE — Telephone Encounter (Signed)
Requested medication (s) are due for refill today: yes  Requested medication (s) are on the active medication list: yes  Last refill:  01/12/20 #84 3 RF  Future visit scheduled: yes  Notes to clinic:  no notation of mammogram- > 3 months overdue for appt   Requested Prescriptions  Pending Prescriptions Disp Refills   JUNEL FE 1/20 1-20 MG-MCG tablet [Pharmacy Med Name: JUNEL FE 1 MG-20 MCG TABLET] 84 tablet 3    Sig: TAKE 1 TABLET BY MOUTH EVERY DAY     There is no refill protocol information for this order

## 2020-12-03 ENCOUNTER — Encounter: Payer: Self-pay | Admitting: Internal Medicine

## 2020-12-03 ENCOUNTER — Encounter: Payer: BC Managed Care – PPO | Admitting: Internal Medicine

## 2020-12-03 DIAGNOSIS — F329 Major depressive disorder, single episode, unspecified: Secondary | ICD-10-CM

## 2020-12-03 MED ORDER — ESCITALOPRAM OXALATE 20 MG PO TABS: 20.0000 mg | ORAL_TABLET | Freq: Every day | ORAL | 0 refills | Status: DC

## 2020-12-03 MED ORDER — NORETHIN ACE-ETH ESTRAD-FE 1-20 MG-MCG PO TABS: 1.0000 | ORAL_TABLET | Freq: Every day | ORAL | 0 refills | Status: DC

## 2021-02-01 ENCOUNTER — Encounter: Payer: Self-pay | Admitting: Podiatry

## 2021-02-01 ENCOUNTER — Ambulatory Visit: Payer: No Typology Code available for payment source | Admitting: Podiatry

## 2021-02-01 ENCOUNTER — Ambulatory Visit (INDEPENDENT_AMBULATORY_CARE_PROVIDER_SITE_OTHER): Payer: No Typology Code available for payment source

## 2021-02-01 ENCOUNTER — Other Ambulatory Visit: Payer: Self-pay

## 2021-02-01 VITALS — BP 120/81 | HR 67 | Temp 97.9°F | Resp 16

## 2021-02-01 DIAGNOSIS — G5791 Unspecified mononeuropathy of right lower limb: Secondary | ICD-10-CM

## 2021-02-01 DIAGNOSIS — M2041 Other hammer toe(s) (acquired), right foot: Secondary | ICD-10-CM

## 2021-02-01 MED ORDER — METHYLPREDNISOLONE 4 MG PO TBPK: ORAL_TABLET | ORAL | 0 refills | Status: DC

## 2021-02-01 NOTE — Progress Notes (Signed)
° °  HPI: 41 y.o. female presenting today as a reestablish new patient for evaluation of burning sensation with some swelling to the plantar aspect of the second MTP joint of the right foot.  She denies a history of injury.  She denies any change in shoe gear or activity.  This is been going on for a few months now.  She presents for treatment and evaluation  Past Medical History:  Diagnosis Date   Depression     Past Surgical History:  Procedure Laterality Date   WISDOM TOOTH EXTRACTION      Allergies  Allergen Reactions   Hydrocodone Itching     Physical Exam: General: The patient is alert and oriented x3 in no acute distress.  Dermatology: Skin is warm, dry and supple bilateral lower extremities. Negative for open lesions or macerations.  Vascular: Palpable pedal pulses bilaterally. Capillary refill within normal limits.  Negative for any significant edema or erythema  Neurological: Light touch and protective threshold grossly intact  Musculoskeletal Exam: No pedal deformities noted.  There is some slight tenderness around the second MTP joint.  Patient describes the pain more like a burning sensation.  The range of motion is normal to the MTP joint and there is no pain with range of motion.  Radiographic Exam:  Normal osseous mineralization. Joint spaces preserved. No fracture/dislocation/boney destruction.    Assessment: 1.  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 Patient evaluated. X-Rays reviewed.  2.  Patient declined injection today 3.  Continue wearing good supportive [employer] shoes at work.  Patient is a Sales executive 4.  Prescription for Medrol Dosepak.  Patient has meloxicam at home.  Recommend meloxicam 15 mg daily after completion of the Medrol Dosepak 5.  Offloading felt metatarsal pads were applied to the insole of the patient's shoe today.  Additional metatarsal pads were provided to apply to the insoles of the shoes  to offload pressure from the forefoot 6.  Return to clinic as needed      Felecia Shelling, DPM [facility]  Dr. Felecia Shelling, DPM    [address] [place], [place] [address]                Office [phone]  Fax [phone]

## 2021-03-04 ENCOUNTER — Ambulatory Visit (INDEPENDENT_AMBULATORY_CARE_PROVIDER_SITE_OTHER): Payer: No Typology Code available for payment source | Admitting: Internal Medicine

## 2021-03-04 ENCOUNTER — Encounter: Payer: Self-pay | Admitting: Internal Medicine

## 2021-03-04 ENCOUNTER — Other Ambulatory Visit: Payer: Self-pay

## 2021-03-04 ENCOUNTER — Encounter: Payer: BC Managed Care – PPO | Admitting: Internal Medicine

## 2021-03-04 VITALS — BP 121/61 | HR 67 | Temp 97.5°F | Ht 64.0 in | Wt 176.0 lb

## 2021-03-04 DIAGNOSIS — E782 Mixed hyperlipidemia: Secondary | ICD-10-CM | POA: Insufficient documentation

## 2021-03-04 DIAGNOSIS — Z0001 Encounter for general adult medical examination with abnormal findings: Secondary | ICD-10-CM

## 2021-03-04 DIAGNOSIS — F329 Major depressive disorder, single episode, unspecified: Secondary | ICD-10-CM

## 2021-03-04 DIAGNOSIS — E781 Pure hyperglyceridemia: Secondary | ICD-10-CM | POA: Diagnosis not present

## 2021-03-04 DIAGNOSIS — Z1231 Encounter for screening mammogram for malignant neoplasm of breast: Secondary | ICD-10-CM

## 2021-03-04 DIAGNOSIS — Z6830 Body mass index (BMI) 30.0-30.9, adult: Secondary | ICD-10-CM

## 2021-03-04 DIAGNOSIS — E6609 Other obesity due to excess calories: Secondary | ICD-10-CM | POA: Insufficient documentation

## 2021-03-04 DIAGNOSIS — E663 Overweight: Secondary | ICD-10-CM | POA: Insufficient documentation

## 2021-03-04 MED ORDER — NORETHIN ACE-ETH ESTRAD-FE 1-20 MG-MCG PO TABS: 1.0000 | ORAL_TABLET | Freq: Every day | ORAL | 3 refills | Status: DC

## 2021-03-04 MED ORDER — ESCITALOPRAM OXALATE 20 MG PO TABS: 20.0000 mg | ORAL_TABLET | Freq: Every day | ORAL | 3 refills | Status: DC

## 2021-03-04 NOTE — Patient Instructions (Signed)

## 2021-03-04 NOTE — Assessment & Plan Note (Signed)
Controlled on Escitalopram Support offered 

## 2021-03-04 NOTE — Assessment & Plan Note (Signed)
Lipid profile today Encouraged her to consume a low-fat diet and increase aerobic exercise Continue Fish Oil and Niacin OTC

## 2021-03-04 NOTE — Progress Notes (Signed)
Subjective:    Patient ID: Kristina Hancock, female    DOB: Dec 04, 1980, 41 y.o.   MRN: [id number]  HPI  Patient presents to clinic today for her annual exam.  Depression: Chronic, managed on Escitalopram.  She is not currently seeing a therapist.  She denies anxiety, SI/HI.  HLD: Her last LDL was 86, triglycerides 182, 12/2019.  She is taking Fish Oil and Niacin OTC.  She tries to avoid fried foods.   Flu: 10/2018 Tetanus: 02/2012 COVID: [place]vers[place] x3, Moderna x1 Pap smear: 12/2019 Mammogram: Never Vision screening: as needed Dentist: biannually  Diet: She does eat meat. She consumes fruits and veggies. She tries to avoid fried foods. She drinks mostly water. Exercise: Walking  Review of Systems     Past Medical History:  Diagnosis Date   Depression     Current Outpatient Medications  Medication Sig Dispense Refill   Cholecalciferol (D3-1000) 25 MCG (1000 UT) tablet      escitalopram (LEXAPRO) 20 MG tablet Take 1 tablet (20 mg total) by mouth daily. 90 tablet 0   niacin 250 MG tablet      norethindrone-ethinyl estradiol-FE (JUNEL FE 1/20) 1-20 MG-MCG tablet Take 1 tablet by mouth daily. 84 tablet 0   Omega-3 Fatty Acids (FISH OIL) 500 MG CAPS Take by mouth.     No current facility-administered medications for this visit.    Allergies  Allergen Reactions   Hydrocodone Itching    Family History  Problem Relation Age of Onset   Hypertension Brother    Stroke Maternal Uncle    Diabetes Maternal Uncle    Cancer Maternal Grandmother        ovarin   Heart disease Paternal Grandfather     Social History   Socioeconomic History   Marital status: Single    Spouse name: Not on file   Number of children: Not on file   Years of education: Not on file   Highest education level: Not on file  Occupational History   Not on file  Tobacco Use   Smoking status: Former   Smokeless tobacco: Never   Tobacco comments:    quit 12 years ago  Substance and Sexual Activity    Alcohol use: Not Currently    Comment: rare   Drug use: Yes    Types: Marijuana   Sexual activity: Never  Other Topics Concern   Not on file  Social History Narrative   Not on file   Social Determinants of Health   Financial Resource Strain: Not on file  Food Insecurity: Not on file  Transportation Needs: Not on file  Physical Activity: Not on file  Stress: Not on file  Social Connections: Not on file  Intimate Partner Violence: Not on file     Constitutional: Denies fever, malaise, fatigue, headache or abrupt weight changes.  HEENT: Denies eye pain, eye redness, ear pain, ringing in the ears, wax buildup, runny nose, nasal congestion, bloody nose, or sore throat. Respiratory: Denies difficulty breathing, shortness of breath, cough or sputum production.   Cardiovascular: Denies chest pain, chest tightness, palpitations or swelling in the hands or feet.  Gastrointestinal: Denies abdominal pain, bloating, constipation, diarrhea or blood in the stool.  GU: Denies urgency, frequency, pain with urination, burning sensation, blood in urine, odor or discharge. Musculoskeletal: Denies decrease in range of motion, difficulty with gait, muscle pain or joint pain and swelling.  Skin: Denies redness, rashes, lesions or ulcercations.  Neurological: Denies dizziness, difficulty with memory, difficulty  with speech or problems with balance and coordination.  Psych: Patient has a history of depression.  Denies anxiety, SI/HI.  No other specific complaints in a complete review of systems (except as listed in HPI above).  Objective:   Physical Exam  BP 121/61 (BP Location: Left Arm, Patient Position: Sitting, Cuff Size: Large)    Pulse 67    Temp (!) 97.5 F (36.4 C) (Temporal)    Ht 5' 4" (1.626 m)    Wt 176 lb (79.8 kg)    SpO2 99%    BMI 30.21 kg/m   Wt Readings from Last 3 Encounters:  01/12/20 167 lb (75.8 kg)  10/28/18 180 lb (81.6 kg)  10/22/17 172 lb (78 kg)    General: Appears  her stated age, obese, in NAD. Skin: Warm, dry and intact.  HEENT: Head: normal shape and size; Eyes: sclera white and EOMs intact; Neck:  Neck supple, trachea midline. No masses, lumps or thyromegaly present.  Cardiovascular: Normal rate and rhythm. S1,S2 noted.  No murmur, rubs or gallops noted. No JVD or BLE edema.  Pulmonary/Chest: Normal effort and positive vesicular breath sounds. No respiratory distress. No wheezes, rales or ronchi noted.  Abdomen: Soft and nontender. Normal bowel sounds.  Musculoskeletal: Strength 5/5 BUE/BLE. No difficulty with gait.  Neurological: Alert and oriented. Cranial nerves II-XII grossly intact. Coordination normal.  Psychiatric: Mood and affect normal. Behavior is normal. Judgment and thought content normal.    BMET    Component Value Date/Time   NA 138 01/12/2020 0847   NA 140 09/14/2015 1454   K 4.1 01/12/2020 0847   CL 103 01/12/2020 0847   CO2 31 01/12/2020 0847   GLUCOSE 90 01/12/2020 0847   BUN 10 01/12/2020 0847   BUN 11 09/14/2015 1454   CREATININE 0.82 01/12/2020 0847   CREATININE 0.82 09/19/2016 1611   CALCIUM 9.3 01/12/2020 0847   GFRNONAA 104 09/14/2015 1454   GFRAA 119 09/14/2015 1454    Lipid Panel     Component Value Date/Time   CHOL 185 01/12/2020 0847   TRIG 182.0 (H) 01/12/2020 0847   HDL 67.60 01/12/2020 0847   CHOLHDL 3 01/12/2020 0847   VLDL 36.4 01/12/2020 0847   LDLCALC 81 01/12/2020 0847    CBC    Component Value Date/Time   WBC 5.2 01/12/2020 0847   RBC 4.94 01/12/2020 0847   HGB 14.9 01/12/2020 0847   HGB 14.4 09/14/2015 1454   HCT 43.7 01/12/2020 0847   HCT 42.7 09/14/2015 1454   PLT 306.0 01/12/2020 0847   PLT 328 09/14/2015 1454   MCV 88.4 01/12/2020 0847   MCV 86 09/14/2015 1454   MCH 29.2 09/19/2016 1611   MCHC 34.0 01/12/2020 0847   RDW 13.0 01/12/2020 0847   RDW 12.9 09/14/2015 1454   LYMPHSABS 2.4 09/14/2015 1454   EOSABS 0.2 09/14/2015 1454   BASOSABS 0.0 09/14/2015 1454    Hgb  A1C Lab Results  Component Value Date   HGBA1C 5.2 10/22/2017            Assessment & Plan:   Preventative Health Maintenance:  She declines flu shot today Tetanus UTD COVID-vaccine UTD Pap smear UTD Mammogram ordered-she will call to schedule Encouraged her to consume a balanced diet and exercise regimen Advised her to see an eye doctor and dentist annually We will check CBC, c-Met, lipid, A1c today  RTC in 1 year, sooner if needed Webb Silversmith, NP This visit occurred during the SARS-CoV-2 public health emergency.  Safety protocols were in place, including screening questions prior to the visit, additional usage of staff PPE, and extensive cleaning of exam room while observing appropriate contact time as indicated for disinfecting solutions.

## 2021-03-04 NOTE — Assessment & Plan Note (Signed)
Encourage diet and exercise for weight loss 

## 2021-03-05 LAB — COMPLETE METABOLIC PANEL WITH GFR
AG Ratio: 1.6 (calc) (ref 1.0–2.5)
ALT: 13 U/L (ref 6–29)
AST: 8 U/L — ABNORMAL LOW (ref 10–30)
Albumin: 4.1 g/dL (ref 3.6–5.1)
Alkaline phosphatase (APISO): 55 U/L (ref 31–125)
BUN: 13 mg/dL (ref 7–25)
CO2: 30 mmol/L (ref 20–32)
Calcium: 9.6 mg/dL (ref 8.6–10.2)
Chloride: 104 mmol/L (ref 98–110)
Creat: 0.8 mg/dL (ref 0.50–0.99)
Globulin: 2.6 g/dL (calc) (ref 1.9–3.7)
Glucose, Bld: 103 mg/dL — ABNORMAL HIGH (ref 65–99)
Potassium: 4.1 mmol/L (ref 3.5–5.3)
Sodium: 140 mmol/L (ref 135–146)
Total Bilirubin: 0.6 mg/dL (ref 0.2–1.2)
Total Protein: 6.7 g/dL (ref 6.1–8.1)
eGFR: 95 mL/min/{1.73_m2} (ref >=60)

## 2021-03-05 LAB — LIPID PANEL
Cholesterol: 204 mg/dL — ABNORMAL HIGH (ref <200)
HDL: 64 mg/dL (ref >=50)
LDL Cholesterol (Calc): 106 mg/dL (calc) — ABNORMAL HIGH
Non-HDL Cholesterol (Calc): 140 mg/dL (calc) — ABNORMAL HIGH (ref <130)
Total CHOL/HDL Ratio: 3.2 (calc) (ref <5.0)
Triglycerides: 218 mg/dL — ABNORMAL HIGH (ref <150)

## 2021-03-05 LAB — CBC
HCT: 45.5 % — ABNORMAL HIGH (ref 35.0–45.0)
Hemoglobin: 15.1 g/dL (ref 11.7–15.5)
MCH: 29.9 pg (ref 27.0–33.0)
MCHC: 33.2 g/dL (ref 32.0–36.0)
MCV: 90.1 fL (ref 80.0–100.0)
MPV: 11 fL (ref 7.5–12.5)
Platelets: 301 10*3/uL (ref 140–400)
RBC: 5.05 10*6/uL (ref 3.80–5.10)
RDW: 12.1 % (ref 11.0–15.0)
WBC: 5.2 10*3/uL (ref 3.8–10.8)

## 2021-03-05 LAB — HEMOGLOBIN A1C
Hgb A1c MFr Bld: 5.1 % of total Hgb (ref <5.7)
Mean Plasma Glucose: 100 mg/dL
eAG (mmol/L): 5.5 mmol/L

## 2021-03-06 ENCOUNTER — Encounter: Payer: Self-pay | Admitting: Internal Medicine

## 2021-05-10 ENCOUNTER — Ambulatory Visit
Admission: RE | Admit: 2021-05-10 | Discharge: 2021-05-10 | Disposition: A | Discharge status: Home / Self Care | Payer: No Typology Code available for payment source | Source: Ambulatory Visit | Attending: Internal Medicine | Admitting: Internal Medicine

## 2021-05-10 DIAGNOSIS — Z1231 Encounter for screening mammogram for malignant neoplasm of breast: Secondary | ICD-10-CM | POA: Insufficient documentation

## 2021-08-30 ENCOUNTER — Encounter: Payer: Self-pay | Admitting: Internal Medicine

## 2021-08-30 ENCOUNTER — Ambulatory Visit (INDEPENDENT_AMBULATORY_CARE_PROVIDER_SITE_OTHER): Payer: No Typology Code available for payment source | Admitting: Internal Medicine

## 2021-08-30 VITALS — BP 118/76 | HR 73 | Temp 97.5°F

## 2021-08-30 DIAGNOSIS — Z566 Other physical and mental strain related to work: Secondary | ICD-10-CM | POA: Diagnosis not present

## 2021-08-30 DIAGNOSIS — L509 Urticaria, unspecified: Secondary | ICD-10-CM | POA: Diagnosis not present

## 2021-08-30 DIAGNOSIS — K219 Gastro-esophageal reflux disease without esophagitis: Secondary | ICD-10-CM | POA: Diagnosis not present

## 2021-08-30 MED ORDER — METHYLPREDNISOLONE ACETATE 80 MG/ML IJ SUSP
80.0000 mg | Freq: Once | INTRAMUSCULAR | Status: AC
  Administered 2021-08-30: 80 mg via INTRAMUSCULAR

## 2021-08-30 MED ORDER — METHYLPREDNISOLONE ACETATE 80 MG/ML IJ SUSP: 80.0000 mg | Freq: Once | INTRAMUSCULAR | Status: DC

## 2021-08-30 NOTE — Addendum Note (Signed)
Addended by: Lorre Munroe on: 08/30/2021 02:41 PM   Modules accepted: Orders

## 2021-08-30 NOTE — Progress Notes (Signed)
Subjective:    Patient ID: Tonye Royalty, female    DOB: 12/10/1980, 41 y.o.   MRN: [id number]  HPI  Patient presents to clinic today with complaint of nausea and loss of appetite.  She noticed this 1 week ago.  She denies vomiting, diarrhea, constipation or blood in her stool.  She has tried [employer] with improvement in her nausea and loss of appetite. She has started taking the Nexium more routinely. She has been under a lot of stress at work.  She also reports a rash of bilateral axilla. This started 2 months ago. The rash it intermittent. The rash is very itchy.  She has been using a natural deodorant instead of antiperspirant but is not sure that this is a contributing factor.  She has tried Hydrocortisone cream.  She is not allergic to anything that she is aware of.  She has been under a lot of stress lately.  Review of Systems     Past Medical History:  Diagnosis Date   Depression     Current Outpatient Medications  Medication Sig Dispense Refill   Cholecalciferol (D3-1000) 25 MCG (1000 UT) tablet      escitalopram (LEXAPRO) 20 MG tablet Take 1 tablet (20 mg total) by mouth daily. 90 tablet 3   niacin 250 MG tablet      norethindrone-ethinyl estradiol-FE (JUNEL FE 1/20) 1-20 MG-MCG tablet Take 1 tablet by mouth daily. 84 tablet 3   Omega-3 Fatty Acids (FISH OIL) 500 MG CAPS Take by mouth.     No current facility-administered medications for this visit.    Allergies  Allergen Reactions   Hydrocodone Itching    Family History  Problem Relation Age of Onset   Diabetes type II Mother    Diabetes type II Father    Heart disease Father    Hypertension Brother    Ovarian cancer Maternal Grandmother    Heart disease Paternal Grandfather    Stroke Maternal Uncle    Diabetes Maternal Uncle     Social History   Socioeconomic History   Marital status: Single    Spouse name: Not on file   Number of children: Not on file   Years of education: Not on file   Highest  education level: Not on file  Occupational History   Not on file  Tobacco Use   Smoking status: Former   Smokeless tobacco: Never   Tobacco comments:    quit 12 years ago  Vaping Use   Vaping Use: Never used  Substance and Sexual Activity   Alcohol use: Not Currently    Comment: rare   Drug use: Yes    Types: Marijuana   Sexual activity: Never  Other Topics Concern   Not on file  Social History Narrative   Not on file   Social Determinants of Health   Financial Resource Strain: Not on file  Food Insecurity: Not on file  Transportation Needs: Not on file  Physical Activity: Not on file  Stress: Not on file  Social Connections: Not on file  Intimate Partner Violence: Not on file     Constitutional: Denies fever, malaise, fatigue, headache or abrupt weight changes.  HEENT: Denies eye pain, eye redness, ear pain, ringing in the ears, wax buildup, runny nose, nasal congestion, bloody nose, or sore throat. Respiratory: Denies difficulty breathing, shortness of breath, cough or sputum production.   Cardiovascular: Denies chest pain, chest tightness, palpitations or swelling in the hands or feet.  Gastrointestinal: Patient  reports nausea and loss of appetite.  Denies abdominal pain, bloating, constipation, diarrhea or blood in the stool.  Skin: Patient reports rash bilateral axilla.  Denies ulcercations.  Psych: Patient reports stress, has a history of depression.  Denies anxiety, SI/HI.  No other specific complaints in a complete review of systems (except as listed in HPI above).  Objective:   Physical Exam  BP 118/76 (BP Location: Left Arm, Patient Position: Sitting, Cuff Size: Normal)   Pulse 73   Temp (!) 97.5 F (36.4 C) (Temporal)   SpO2 98%   Wt Readings from Last 3 Encounters:  03/04/21 176 lb (79.8 kg)  01/12/20 167 lb (75.8 kg)  10/28/18 180 lb (81.6 kg)    General: Appears her stated age, obese, in NAD. Skin: Warm, dry and intact.  Hives noted to  bilateral axilla. Cardiovascular: Normal rate and rhythm.  Pulmonary/Chest: Normal effort and positive vesicular breath sounds. No respiratory distress. No wheezes, rales or ronchi noted.  Abdomen: Normal bowel sound Neurological: Alert and oriented. Psychiatric: Mood and affect normal.  Tearful. Judgment and thought content normal.    BMET    Component Value Date/Time   NA 140 03/04/2021 0837   NA 140 09/14/2015 1454   K 4.1 03/04/2021 0837   CL 104 03/04/2021 0837   CO2 30 03/04/2021 0837   GLUCOSE 103 (H) 03/04/2021 0837   BUN 13 03/04/2021 0837   BUN 11 09/14/2015 1454   CREATININE 0.80 03/04/2021 0837   CALCIUM 9.6 03/04/2021 0837   GFRNONAA 104 09/14/2015 1454   GFRAA 119 09/14/2015 1454    Lipid Panel     Component Value Date/Time   CHOL 204 (H) 03/04/2021 0837   TRIG 218 (H) 03/04/2021 0837   HDL 64 03/04/2021 0837   CHOLHDL 3.2 03/04/2021 0837   VLDL 36.4 01/12/2020 0847   LDLCALC 106 (H) 03/04/2021 0837    CBC    Component Value Date/Time   WBC 5.2 03/04/2021 0837   RBC 5.05 03/04/2021 0837   HGB 15.1 03/04/2021 0837   HGB 14.4 09/14/2015 1454   HCT 45.5 (H) 03/04/2021 0837   HCT 42.7 09/14/2015 1454   PLT 301 03/04/2021 0837   PLT 328 09/14/2015 1454   MCV 90.1 03/04/2021 0837   MCV 86 09/14/2015 1454   MCH 29.9 03/04/2021 0837   MCHC 33.2 03/04/2021 0837   RDW 12.1 03/04/2021 0837   RDW 12.9 09/14/2015 1454   LYMPHSABS 2.4 09/14/2015 1454   EOSABS 0.2 09/14/2015 1454   BASOSABS 0.0 09/14/2015 1454    Hgb A1C Lab Results  Component Value Date   HGBA1C 5.1 03/04/2021           Assessment & Plan:  Hives:  80 mg Depo-Medrol IM x1 She will start daily antihistamine in addition to her PPI Consider referral for allergy testing if symptoms persist She will stop low-dose x1 week then restart and see if hives recur.  Schedule an appointment for follow-up of chronic conditions Nicki Reaper, NP

## 2021-08-30 NOTE — Addendum Note (Signed)
Addended by: Kavin Leech E on: 08/30/2021 02:29 PM   Modules accepted: Orders

## 2021-08-30 NOTE — Patient Instructions (Signed)
Hives Hives are itchy, red, swollen areas on your skin. Hives can show up on any part of your body. Hives often fade within 24 hours (acute hives). New hives can show up after old ones fade. This can go on for many days or weeks (chronic hives). Hives do not spread from person to person (are not contagious). Hives are caused by your body's response to something that you are allergic to (allergen). These are sometimes called triggers. You can get hives right after being around a trigger, or hours later. What are the causes? Allergies to foods. Insect bites or stings. Exposure to pollen or pets. Spending time in sunlight, heat, or cold. Exercise. Stress. You can also get hives from other medical conditions and treatments, such as: Some medicines. Chemicals or latex. Viruses. This includes the common cold. Infections caused by germs (bacteria). Allergy shots. Blood transfusions. Sometimes, the cause is not known. What increases the risk? Being a woman. Being allergic to foods such as: Citrus fruits. Milk. Eggs. Peanuts. Tree nuts. Shellfish. Being allergic to: Medicines. Latex. Insects. Animals. Pollen. What are the signs or symptoms?  Raised, itchy, red or white bumps or patches on your skin. These areas may: Get large and swollen. Change in shape and location. Stand alone or connect to each other over a large area of skin. Sting or hurt. Turn white when pressed in the center (blanch). In very bad cases, your hands, feet, and face may also get swollen. This may happen if hives start deeper in your skin. How is this treated? Treatment for this condition depends on your symptoms. Treatment may include: Using cool, wet cloths (cool compresses) or taking cool showers to stop the itching. Medicines that help: Relieve itching (antihistamines). Reduce swelling (corticosteroids). Treat infection (antibiotics). A medicine (omalizumab) that is given as a shot (injection). Your  doctor may prescribe this if you have hives that do not get better even after other treatments. In very bad cases, you may need a shot of a medicine called epinephrine to prevent a life-threatening allergic reaction (anaphylaxis). Follow these instructions at home: Medicines Take or apply over-the-counter and prescription medicines only as told by your doctor. If you were prescribed an antibiotic medicine, use it as told by your doctor. Do not stop using it even if you start to feel better. Skin care Apply cool, wet cloths to the hives. Do not scratch your skin. Do not rub your skin. General instructions Do not take hot showers or baths. This can make itching worse. Do not wear tight clothes. Use sunscreen and wear clothes that cover your skin when you are outside. Avoid any triggers that cause your hives. Keep a journal to help track what causes your hives. Write down: What medicines you take. What you eat and drink. What products you use on your skin. Keep all follow-up visits as told by your doctor. This is important. Contact a doctor if: Your symptoms are not better with medicine. Your joints hurt or are swollen. Get help right away if: You have a fever. You have pain in your belly (abdomen). Your tongue or lips are swollen. Your eyelids are swollen. Your chest or throat feels tight. You have trouble breathing or swallowing. These symptoms may be an emergency. Do not wait to see if the symptoms will go away. Get medical help right away. Call your local emergency services (911 in the [place] Do not drive yourself to the hospital. Summary Hives are itchy, red, swollen areas on your skin. Treatment   for this condition depends on your symptoms. Avoid things that cause your hives. Keep a journal to help track what causes your hives. Take and apply over-the-counter and prescription medicines only as told by your doctor. Get help right away if your chest or throat feels tight or if you  have trouble breathing or swallowing. This information is not intended to replace advice given to you by your health care provider. Make sure you discuss any questions you have with your health care provider. Document Revised: 02/24/2020 Document Reviewed: 02/26/2020 Elsevier Patient Education  2023 [employer]  

## 2021-08-30 NOTE — Assessment & Plan Note (Signed)
Likely related to stress Continue Nexium daily

## 2021-09-01 ENCOUNTER — Encounter: Payer: Self-pay | Admitting: Internal Medicine

## 2021-09-02 ENCOUNTER — Encounter: Payer: Self-pay | Admitting: Internal Medicine

## 2021-09-03 MED ORDER — ESOMEPRAZOLE MAGNESIUM 20 MG PO CPDR: 20.0000 mg | DELAYED_RELEASE_CAPSULE | Freq: Every day | ORAL | 0 refills | Status: DC

## 2021-09-03 MED ORDER — PREDNISONE 10 MG PO TABS: ORAL_TABLET | ORAL | 0 refills | Status: DC

## 2021-09-10 ENCOUNTER — Encounter: Payer: Self-pay | Admitting: Internal Medicine

## 2021-09-10 DIAGNOSIS — L509 Urticaria, unspecified: Secondary | ICD-10-CM

## 2021-09-10 MED ORDER — HYDROCORTISONE 2.5 % EX CREA: TOPICAL_CREAM | Freq: Two times a day (BID) | CUTANEOUS | 0 refills | Status: DC

## 2021-09-13 ENCOUNTER — Ambulatory Visit: Payer: No Typology Code available for payment source | Admitting: Internal Medicine

## 2021-10-01 ENCOUNTER — Encounter: Payer: Self-pay | Admitting: Internal Medicine

## 2021-10-01 MED ORDER — PREDNISONE 10 MG PO TABS: ORAL_TABLET | ORAL | 0 refills | Status: DC

## 2021-11-01 ENCOUNTER — Telehealth: Payer: No Typology Code available for payment source | Admitting: Family Medicine

## 2021-11-01 DIAGNOSIS — U071 COVID-19: Secondary | ICD-10-CM | POA: Diagnosis not present

## 2021-11-01 MED ORDER — BENZONATATE 200 MG PO CAPS: 200.0000 mg | ORAL_CAPSULE | Freq: Two times a day (BID) | ORAL | 0 refills | Status: DC | PRN

## 2021-11-01 NOTE — Progress Notes (Signed)
Virtual Visit Consent   [name], you are scheduled for a virtual visit with a [facility] provider today. Just as with appointments in the office, your consent must be obtained to participate. Your consent will be active for this visit and any virtual visit you may have with one of our providers in the next 365 days. If you have a MyChart account, a copy of this consent can be sent to you electronically.  As this is a virtual visit, video technology does not allow for your provider to perform a traditional examination. This may limit your provider's ability to fully assess your condition. If your provider identifies any concerns that need to be evaluated in person or the need to arrange testing (such as labs, EKG, etc.), we will make arrangements to do so. Although advances in technology are sophisticated, we cannot ensure that it will always work on either your end or our end. If the connection with a video visit is poor, the visit may have to be switched to a telephone visit. With either a video or telephone visit, we are not always able to ensure that we have a secure connection.  By engaging in this virtual visit, you consent to the provision of healthcare and authorize for your insurance to be billed (if applicable) for the services provided during this visit. Depending on your insurance coverage, you may receive a charge related to this service.  I need to obtain your verbal consent now. Are you willing to proceed with your visit today? [name] has provided verbal consent on 11/01/2021 for a virtual visit (video or telephone). [name], FNP  Date: 11/01/2021 3:05 PM  Virtual Visit via Video Note   I, [name], connected with  Kristina Hancock  ([id number], November 21, 1980) on 11/01/21 at  3:00 PM EDT by a video-enabled telemedicine application and verified that I am speaking with the correct person using two identifiers.  Location: Patient: Virtual Visit Location Patient:  Home Provider: Virtual Visit Location Provider: Home Office   I discussed the limitations of evaluation and management by telemedicine and the availability of in person appointments. The patient expressed understanding and agreed to proceed.    History of Present Illness: Kristina Hancock is a 42 y.o. who identifies as a female who was assigned female at birth, and is being seen today for covid positive with sx starting 2 days. Sx include HA, cough, loss of taste and smell, diarrhea, no wheezing or sob, requests cough meds, fever with no chronic health conditions. Marland Hancock  HPI: HPI  Problems:  Patient Active Problem List   Diagnosis Date Noted  . GERD (gastroesophageal reflux disease) 08/30/2021  . Hypertriglyceridemia 03/04/2021  . Class 1 obesity due to excess calories with body mass index (BMI) of 30.0 to 30.9 in adult 03/04/2021  . Depression 11/25/2013    Allergies:  Allergies  Allergen Reactions  . Hydrocodone Itching   Medications:  Current Outpatient Medications:  .  benzonatate (TESSALON) 200 MG capsule, Take 1 capsule (200 mg total) by mouth 2 (two) times daily as needed for cough., Disp: 20 capsule, Rfl: 0 .  Cholecalciferol (D3-1000) 25 MCG (1000 UT) tablet, , Disp: , Rfl:  .  escitalopram (LEXAPRO) 20 MG tablet, Take 1 tablet (20 mg total) by mouth daily., Disp: 90 tablet, Rfl: 3 .  esomeprazole (NEXIUM 24HR) 20 MG capsule, Take 1 capsule (20 mg total) by mouth daily at 12 noon., Disp: 90 capsule, Rfl: 0 .  hydrocortisone 2.5 % cream, Apply  topically 2 (two) times daily., Disp: 30 g, Rfl: 0 .  niacin 250 MG tablet, , Disp: , Rfl:  .  norethindrone-ethinyl estradiol-FE (JUNEL FE 1/20) 1-20 MG-MCG tablet, Take 1 tablet by mouth daily., Disp: 84 tablet, Rfl: 3 .  Omega-3 Fatty Acids (FISH OIL) 500 MG CAPS, Take by mouth., Disp: , Rfl:  .  predniSONE (DELTASONE) 10 MG tablet, Take 3 tabs on days 1-3, 2 tabs on days 4-6, 1 tab on days 7-9, Disp: 18 tablet, Rfl:  0  Observations/Objective: Patient is well-developed, well-nourished in no acute distress.  Resting comfortably  at home.  Head is normocephalic, atraumatic.  No labored breathing.  Speech is clear and coherent with logical content.  Patient is alert and oriented at baseline.    Assessment and Plan: 1. COVID  Increase fluids, ibuprofen or tylenol, MVI with VIT d, c, and zinc, urgent care if sx worsen.   Follow Up Instructions: I discussed the assessment and treatment plan with the patient. The patient was provided an opportunity to ask questions and all were answered. The patient agreed with the plan and demonstrated an understanding of the instructions.  A copy of instructions were sent to the patient via MyChart unless otherwise noted below.     The patient was advised to call back or seek an in-person evaluation if the symptoms worsen or if the condition fails to improve as anticipated.  Time:  I spent 10 minutes with the patient via telehealth technology discussing the above problems/concerns.    [name], FNP

## 2021-11-01 NOTE — Patient Instructions (Signed)
COVID-19 COVID-19, or coronavirus disease 2019, is an infection that is caused by a new (novel) coronavirus called SARS-CoV-2. COVID-19 can cause many symptoms. In some people, the virus may not cause any symptoms. In others, it may cause mild or severe symptoms. Some people with severe infection develop severe disease. What are the causes? This illness is caused by a virus. The virus may be in the air as tiny specks of fluid (aerosols) or droplets, or it may be on surfaces. You may catch the virus by: Breathing in droplets from an infected person. Droplets can be spread by a person breathing, speaking, singing, coughing, or sneezing. Touching something, like a table or a doorknob, that has virus on it (is contaminated) and then touching your mouth, nose, or eyes. What increases the risk? Risk for infection: You are more likely to get infected with the COVID-19 virus if: You are within 6 ft (1.8 m) of a person with COVID-19 for 15 minutes or longer. You are providing care for a person who is infected with COVID-19. You are in close personal contact with other people. Close personal contact includes hugging, kissing, or sharing eating or drinking utensils. Risk for serious illness caused by COVID-19: You are more likely to get seriously ill from the COVID-19 virus if: You have cancer. You have a long-term (chronic) disease, such as: Chronic lung disease. This includes pulmonary embolism, chronic obstructive pulmonary disease, and cystic fibrosis. Long-term disease that lowers your body's ability to fight infection (immunocompromise). Serious cardiac conditions, such as heart failure, coronary artery disease, or cardiomyopathy. Diabetes. Chronic kidney disease. Liver diseases. These include cirrhosis, nonalcoholic fatty liver disease, alcoholic liver disease, or autoimmune hepatitis. You have obesity. You are pregnant or were recently pregnant. You have sickle cell disease. What are the signs  or symptoms? Symptoms of this condition can range from mild to severe. Symptoms may appear any time from 2 to 14 days after being exposed to the virus. They include: Fever or chills. Shortness of breath or trouble breathing. Feeling tired or very tired. Headaches, body aches, or muscle aches. Runny or stuffy nose, sneezing, coughing, or sore throat. New loss of taste or smell. This is rare. Some people may also have stomach problems, such as nausea, vomiting, or diarrhea. Other people may not have any symptoms of COVID-19. How is this diagnosed? This condition may be diagnosed by testing samples to check for the COVID-19 virus. The most common tests are the PCR test and the antigen test. Tests may be done in the lab or at home. They include: Using a swab to take a sample of fluid from the back of your nose and throat (nasopharyngeal fluid), from your nose, or from your throat. Testing a sample of saliva from your mouth. Testing a sample of coughed-up mucus from your lungs (sputum). How is this treated? Treatment for COVID-19 infection depends on the severity of the condition. Mild symptoms can be managed at home with rest, fluids, and over-the-counter medicines. Serious symptoms may be treated in a hospital intensive care unit (ICU). Treatment in the ICU may include: Supplemental oxygen. Extra oxygen is given through a tube in the nose, a face mask, or a hood. Medicines. These may include: Antivirals, such as monoclonal antibodies. These help your body fight off certain viruses that can cause disease. Anti-inflammatories, such as corticosteroids. These reduce inflammation and suppress the immune system. Antithrombotics. These prevent or treat blood clots, if they develop. Convalescent plasma. This helps boost your immune system, if   you have an underlying immunosuppressive condition or are getting immunosuppressive treatments. Prone positioning. This means you will lie on your stomach. This  helps oxygen to get into your lungs. Infection control measures. If you are at risk for more serious illness caused by COVID-19, your health care provider may prescribe two long-acting monoclonal antibodies, given together every 6 months. How is this prevented? To protect yourself: Use preventive medicine (pre-exposure prophylaxis). You may get pre-exposure prophylaxis if you have moderate or severe immunocompromise. Get vaccinated. Anyone 6 months old or older who meets guidelines can get a COVID-19 vaccine or vaccine series. This includes people who are pregnant or making breast milk (lactating). Get an added dose of COVID-19 vaccine after your first vaccine or vaccine series if you have moderate to severe immunocompromise. This applies if you have had a solid organ transplant or have been diagnosed with an immunocompromising condition. You should get the added dose 4 weeks after you got the first COVID-19 vaccine or vaccine series. If you get an mRNA vaccine, you will need a 3-dose primary series. If you get the J&J/Janssen vaccine, you will need a 2-dose primary series, with the second dose being an mRNA vaccine. Talk to your health care provider about getting experimental monoclonal antibodies. This treatment is approved under emergency use authorization to prevent severe illness before or after being exposed to the COVID-19 virus. You may be given monoclonal antibodies if: You have moderate or severe immunocompromise. This includes treatments that lower your immune response. People with immunocompromise may not develop protection against COVID-19 when they are vaccinated. You cannot be vaccinated. You may not get a vaccine if you have a severe allergic reaction to the vaccine or its components. You are not fully vaccinated. You are in a facility where COVID-19 is present and: Are in close contact with a person who is infected with the COVID-19 virus. Are at high risk of being exposed to the  COVID-19 virus. You are at risk of illness from new variants of the COVID-19 virus. To protect others: If you have symptoms of COVID-19, take steps to prevent the virus from spreading to others. Stay home. Leave your house only to get medical care. Do not use public transit, if possible. Do not travel while you are sick. Wash your hands often with soap and water for at least 20 seconds. If soap and water are not available, use alcohol-based hand sanitizer. Make sure that all people in your household wash their hands well and often. Cough or sneeze into a tissue or your sleeve or elbow. Do not cough or sneeze into your hand or into the air. Where to find more information Centers for Disease Control and Prevention: www.cdc.gov/coronavirus World Health Organization: www.who.int/health-topics/coronavirus Get help right away if: You have trouble breathing. You have pain or pressure in your chest. You are confused. You have bluish lips and fingernails. You have trouble waking from sleep. You have symptoms that get worse. These symptoms may be an emergency. Get help right away. Call 911. Do not wait to see if the symptoms will go away. Do not drive yourself to the hospital. Summary COVID-19 is an infection that is caused by a new coronavirus. Sometimes, there are no symptoms. Other times, symptoms range from mild to severe. Some people with a severe COVID-19 infection develop severe disease. The virus that causes COVID-19 can spread from person to person through droplets or aerosols from breathing, speaking, singing, coughing, or sneezing. Mild symptoms of COVID-19 can be managed   at home with rest, fluids, and over-the-counter medicines. This information is not intended to replace advice given to you by your health care provider. Make sure you discuss any questions you have with your health care provider. Document Revised: 12/27/2020 Document Reviewed: 12/27/2020 Elsevier Patient Education   [place] Under Monitoring Name: Kristina Hancock  Location: [place] [address]   CORONAVIRUS DISEASE 2019 (COVID-19) Guidance for Persons Under Investigation You are being tested for the virus that causes coronavirus disease 2019 (COVID-19). Public health actions are necessary to ensure protection of your health and the health of others, and to prevent further spread of infection. COVID-19 is caused by a virus that can cause symptoms, such as fever, cough, and shortness of breath. The primary transmission from person to person is by coughing or sneezing. On February 18, 2018, the [place] announced a [employer] Emergency of International Concern and on February 19, 2018 the U.S. Department of Health and Human Services declared a public health emergency. If the virus that causesCOVID-19 spreads in the community, it could have severe public health consequences.  As a person under investigation for COVID-19, the [place] advises you to adhere to the following guidance until your test results are reported to you. If your test result is positive, you will receive additional information from your provider and your local health department at that time.  Remain at home until you are cleared by your health provider or public health authorities.  Keep a log of visitors to your home using the form provided. Any visitors to your home must be aware of your isolation status. If you plan to move to a new address or leave the county, notify the local health department in your county. Call a doctor or seek care if you have an urgent medical need. Before seeking medical care, call ahead and get instructions from the provider before arriving at the medical office, clinic or hospital. Notify them that you are being tested for the virus that causes COVID-19 so arrangements can be  made, as necessary, to prevent transmission to others in the healthcare setting. Next, notify the local health department in your county. If a medical emergency arises and you need to call 911, inform the first responders that you are being tested for the virus that causes COVID-19. Next, notify the local health department in your county. Adhere to all guidance set forth by the [place] for [facility] of patients that is based on guidance from the Center for Disease Control and Prevention with suspected or confirmed COVID-19. It is provided with this guidance for Persons Under Investigation.  Your health and the health of our community are our top priorities. Public Health officials remain available to provide assistance and counseling to you about COVID-19 and compliance with this guidance.  Provider: ____________________________________________________________ Date: ______/_____/_________  By signing below, you acknowledge that you have read and agree to comply with this Guidance for Persons Under Investigation. ______________________________________________________________ Date: ______/_____/_________  WHO DO I CALL? You can find a list of local health departments here: [email or web address] Health Department: ____________________________________________________________________ Contact Name: ________________________________________________________________________ Telephone: ___________________________________________________________________________  Marice Potter, [place], Communicable Disease Branch COVID-19 Guidance for Persons Under Investigation March 27, 2018

## 2021-11-29 ENCOUNTER — Other Ambulatory Visit: Payer: Self-pay | Admitting: Internal Medicine

## 2021-11-29 NOTE — Telephone Encounter (Signed)
Requested Prescriptions  Pending Prescriptions Disp Refills   esomeprazole (NEXIUM) 20 MG capsule [Pharmacy Med Name: ESOMEPRAZOLE MAG DR 20 MG CAP] 90 capsule 0    Sig: TAKE 1 CAPSULE (20 MG TOTAL) BY MOUTH DAILY AT 12 NOON.     Gastroenterology: Proton Pump Inhibitors 2 Failed - 11/29/2021  2:10 AM      Failed - AST in normal range and within 360 days    AST  Date Value Ref Range Status  03/04/2021 8 (L) 10 - 30 U/L Final         Passed - ALT in normal range and within 360 days    ALT  Date Value Ref Range Status  03/04/2021 13 6 - 29 U/L Final         Passed - Valid encounter within last 12 months    Recent Outpatient Visits           3 months ago Hives   [facility] [place], Salvadore Oxford, NP   9 months ago Encounter for general adult medical examination with abnormal findings   [facility] [place], Salvadore Oxford, NP

## 2022-02-09 ENCOUNTER — Other Ambulatory Visit: Payer: Self-pay | Admitting: Internal Medicine

## 2022-02-10 NOTE — Telephone Encounter (Signed)
Requested Prescriptions  Pending Prescriptions Disp Refills   JUNEL FE 1/20 1-20 MG-MCG tablet [Pharmacy Med Name: JUNEL FE 1 MG-20 MCG TABLET] 84 tablet 0    Sig: TAKE 1 TABLET BY MOUTH EVERY DAY     OB/GYN:  Contraceptives Passed - 02/09/2022  8:12 AM      Passed - Last BP in normal range    BP Readings from Last 1 Encounters:  08/30/21 118/76         Passed - Valid encounter within last 12 months    Recent Outpatient Visits           5 months ago [facility], Coralie Keens, NP   11 months ago Encounter for general adult medical examination with abnormal findings   [facility] [place], Coralie Keens, NP       Future Appointments             In 3 weeks Garnette Gunner, Coralie Keens, NP [facility] [place] - Patient is not a smoker

## 2022-02-14 ENCOUNTER — Ambulatory Visit: Payer: No Typology Code available for payment source | Admitting: Internal Medicine

## 2022-02-19 ENCOUNTER — Other Ambulatory Visit: Payer: Self-pay | Admitting: Internal Medicine

## 2022-02-19 NOTE — Telephone Encounter (Signed)
Requested Prescriptions  Pending Prescriptions Disp Refills   esomeprazole (NEXIUM) 20 MG capsule [Pharmacy Med Name: ESOMEPRAZOLE MAG DR 20 MG CAP] 90 capsule 0    Sig: TAKE 1 CAPSULE (20 MG TOTAL) BY MOUTH DAILY AT 12 NOON.     Gastroenterology: Proton Pump Inhibitors 2 Failed - 02/19/2022  9:31 AM      Failed - AST in normal range and within 360 days    AST  Date Value Ref Range Status  03/04/2021 8 (L) 10 - 30 U/L Final         Passed - ALT in normal range and within 360 days    ALT  Date Value Ref Range Status  03/04/2021 13 6 - 29 U/L Final         Passed - Valid encounter within last 12 months    Recent Outpatient Visits           5 months ago [facility] [place], Coralie Keens, NP   11 months ago Encounter for general adult medical examination with abnormal findings   [facility] [place], Coralie Keens, NP       Future Appointments             In 2 weeks Garnette Gunner, Coralie Keens, NP [facility], [facility]

## 2022-03-03 ENCOUNTER — Encounter: Payer: Self-pay | Admitting: Internal Medicine

## 2022-03-07 ENCOUNTER — Other Ambulatory Visit: Payer: Self-pay | Admitting: Internal Medicine

## 2022-03-07 ENCOUNTER — Encounter: Payer: Self-pay | Admitting: Internal Medicine

## 2022-03-07 ENCOUNTER — Ambulatory Visit (INDEPENDENT_AMBULATORY_CARE_PROVIDER_SITE_OTHER): Payer: No Typology Code available for payment source | Admitting: Internal Medicine

## 2022-03-07 VITALS — BP 120/78 | HR 63 | Temp 96.1°F | Ht 63.5 in | Wt 184.0 lb

## 2022-03-07 DIAGNOSIS — E782 Mixed hyperlipidemia: Secondary | ICD-10-CM

## 2022-03-07 DIAGNOSIS — Z23 Encounter for immunization: Secondary | ICD-10-CM | POA: Diagnosis not present

## 2022-03-07 DIAGNOSIS — Z8349 Family history of other endocrine, nutritional and metabolic diseases: Secondary | ICD-10-CM

## 2022-03-07 DIAGNOSIS — R7309 Other abnormal glucose: Secondary | ICD-10-CM

## 2022-03-07 DIAGNOSIS — Z1231 Encounter for screening mammogram for malignant neoplasm of breast: Secondary | ICD-10-CM | POA: Diagnosis not present

## 2022-03-07 DIAGNOSIS — Z0001 Encounter for general adult medical examination with abnormal findings: Secondary | ICD-10-CM | POA: Diagnosis not present

## 2022-03-07 DIAGNOSIS — E6609 Other obesity due to excess calories: Secondary | ICD-10-CM

## 2022-03-07 DIAGNOSIS — Z6832 Body mass index (BMI) 32.0-32.9, adult: Secondary | ICD-10-CM

## 2022-03-07 NOTE — Assessment & Plan Note (Signed)
Encourage diet and exercise for weight loss 

## 2022-03-07 NOTE — Progress Notes (Signed)
Subjective:    Patient ID: Kristina Hancock, female    DOB: 10/15/1980, 42 y.o.   MRN: [id number]  HPI  Patient presents to clinic today for her annual exam.  Flu: 10/2018 Tetanus: 02/2012 COVID: X 4 Pap smear: 12/2019 Mammogram: 04/2021 Vision screening: as needed Dentist: biannually  Diet: She does eat some meat. She consumes fruits and veggies. She does eat some fried foods. She drinks mostly water and tea. Exercise: Walking  Review of Systems     Past Medical History:  Diagnosis Date   Depression     Current Outpatient Medications  Medication Sig Dispense Refill   benzonatate (TESSALON) 200 MG capsule Take 1 capsule (200 mg total) by mouth 2 (two) times daily as needed for cough. 20 capsule 0   Cholecalciferol (D3-1000) 25 MCG (1000 UT) tablet      escitalopram (LEXAPRO) 20 MG tablet Take 1 tablet (20 mg total) by mouth daily. 90 tablet 3   esomeprazole (NEXIUM) 20 MG capsule TAKE 1 CAPSULE (20 MG TOTAL) BY MOUTH DAILY AT 12 NOON. 90 capsule 0   hydrocortisone 2.5 % cream Apply topically 2 (two) times daily. 30 g 0   niacin 250 MG tablet      norethindrone-ethinyl estradiol-FE (JUNEL FE 1/20) 1-20 MG-MCG tablet TAKE 1 TABLET BY MOUTH EVERY DAY 84 tablet 0   Omega-3 Fatty Acids (FISH OIL) 500 MG CAPS Take by mouth.     predniSONE (DELTASONE) 10 MG tablet Take 3 tabs on days 1-3, 2 tabs on days 4-6, 1 tab on days 7-9 18 tablet 0   No current facility-administered medications for this visit.    Allergies  Allergen Reactions   Hydrocodone Itching    Family History  Problem Relation Age of Onset   Diabetes type II Mother    Diabetes type II Father    Heart disease Father    Hypertension Brother    Ovarian cancer Maternal Grandmother    Heart disease Paternal Grandfather    Stroke Maternal Uncle    Diabetes Maternal Uncle     Social History   Socioeconomic History   Marital status: Single    Spouse name: Not on file   Number of children: Not on file   Years  of education: Not on file   Highest education level: Not on file  Occupational History   Not on file  Tobacco Use   Smoking status: Former   Smokeless tobacco: Never   Tobacco comments:    quit 12 years ago  Vaping Use   Vaping Use: Never used  Substance and Sexual Activity   Alcohol use: Not Currently    Comment: rare   Drug use: Yes    Types: Marijuana   Sexual activity: Never  Other Topics Concern   Not on file  Social History Narrative   Not on file   Social Determinants of Health   Financial Resource Strain: Not on file  Food Insecurity: Not on file  Transportation Needs: Not on file  Physical Activity: Not on file  Stress: Not on file  Social Connections: Not on file  Intimate Partner Violence: Not on file     Constitutional: Denies fever, malaise, fatigue, headache or abrupt weight changes.  HEENT: Denies eye pain, eye redness, ear pain, ringing in the ears, wax buildup, runny nose, nasal congestion, bloody nose, or sore throat. Respiratory: Denies difficulty breathing, shortness of breath, cough or sputum production.   Cardiovascular: Denies chest pain, chest tightness, palpitations or swelling  in the hands or feet.  Gastrointestinal: Pt reports intermittent reflux. Denies abdominal pain, bloating, constipation, diarrhea or blood in the stool.  GU: Denies urgency, frequency, pain with urination, burning sensation, blood in urine, odor or discharge. Musculoskeletal: Denies decrease in range of motion, difficulty with gait, muscle pain or joint pain and swelling.  Skin: Denies redness, rashes, lesions or ulcercations.  Neurological: Denies dizziness, difficulty with memory, difficulty with speech or problems with balance and coordination.  Psych: Patient has a history of depression.  Denies anxiety, SI/HI.  No other specific complaints in a complete review of systems (except as listed in HPI above).  Objective:   Physical Exam   BP 120/78 (BP Location: Right  Arm, Patient Position: Sitting, Cuff Size: Normal)   Pulse 63   Temp (!) 96.1 F (35.6 C) (Temporal)   Ht 5' 3.5" (1.613 m)   Wt 184 lb (83.5 kg)   SpO2 97%   BMI 32.08 kg/m   Wt Readings from Last 3 Encounters:  03/04/21 176 lb (79.8 kg)  01/12/20 167 lb (75.8 kg)  10/28/18 180 lb (81.6 kg)    General: Appears her stated age, obese, in NAD. Skin: Warm, dry and intact. No rashes noted. HEENT: Head: normal shape and size; Eyes: sclera white, no icterus, conjunctiva pink, PERRLA and EOMs intact;  Neck:  Neck supple, trachea midline. No masses, lumps or thyromegaly present.  Cardiovascular: Normal rate and rhythm. S1,S2 noted.  No murmur, rubs or gallops noted. No JVD or BLE edema. Pulmonary/Chest: Normal effort and positive vesicular breath sounds. No respiratory distress. No wheezes, rales or ronchi noted.  Abdomen: Normal bowel sounds. Musculoskeletal: Strength 5/5 BUE/BLE. No difficulty with gait.  Neurological: Alert and oriented. Cranial nerves II-XII grossly intact. Coordination normal.  Psychiatric: Mood and affect normal. Behavior is normal. Judgment and thought content normal.    BMET    Component Value Date/Time   NA 140 03/04/2021 0837   NA 140 09/14/2015 1454   K 4.1 03/04/2021 0837   CL 104 03/04/2021 0837   CO2 30 03/04/2021 0837   GLUCOSE 103 (H) 03/04/2021 0837   BUN 13 03/04/2021 0837   BUN 11 09/14/2015 1454   CREATININE 0.80 03/04/2021 0837   CALCIUM 9.6 03/04/2021 0837   GFRNONAA 104 09/14/2015 1454   GFRAA 119 09/14/2015 1454    Lipid Panel     Component Value Date/Time   CHOL 204 (H) 03/04/2021 0837   TRIG 218 (H) 03/04/2021 0837   HDL 64 03/04/2021 0837   CHOLHDL 3.2 03/04/2021 0837   VLDL 36.4 01/12/2020 0847   LDLCALC 106 (H) 03/04/2021 0837    CBC    Component Value Date/Time   WBC 5.2 03/04/2021 0837   RBC 5.05 03/04/2021 0837   HGB 15.1 03/04/2021 0837   HGB 14.4 09/14/2015 1454   HCT 45.5 (H) 03/04/2021 0837   HCT 42.7  09/14/2015 1454   PLT 301 03/04/2021 0837   PLT 328 09/14/2015 1454   MCV 90.1 03/04/2021 0837   MCV 86 09/14/2015 1454   MCH 29.9 03/04/2021 0837   MCHC 33.2 03/04/2021 0837   RDW 12.1 03/04/2021 0837   RDW 12.9 09/14/2015 1454   LYMPHSABS 2.4 09/14/2015 1454   EOSABS 0.2 09/14/2015 1454   BASOSABS 0.0 09/14/2015 1454    Hgb A1C Lab Results  Component Value Date   HGBA1C 5.1 03/04/2021           Assessment & Plan:   Preventative Health Maintenance:  Encouraged  her to get a flu shot in the fall Tetanus today Encouraged her to get her COVID booster Pap smear UTD Mammogram ordered-she will call to schedule Encouraged her to consume a balanced diet and exercise regimen Advised her to see an eye doctor and dentist annually We will check CBC, c-Met, TSH, lipid, A1c today  RTC in 6 months, follow-up chronic conditions Webb Silversmith, NP

## 2022-03-07 NOTE — Patient Instructions (Signed)

## 2022-03-08 LAB — COMPLETE METABOLIC PANEL WITH GFR
AG Ratio: 1.6 (calc) (ref 1.0–2.5)
ALT: 14 U/L (ref 6–29)
AST: 8 U/L — ABNORMAL LOW (ref 10–30)
Albumin: 4.1 g/dL (ref 3.6–5.1)
Alkaline phosphatase (APISO): 61 U/L (ref 31–125)
BUN: 10 mg/dL (ref 7–25)
CO2: 24 mmol/L (ref 20–32)
Calcium: 9.2 mg/dL (ref 8.6–10.2)
Chloride: 106 mmol/L (ref 98–110)
Creat: 0.72 mg/dL (ref 0.50–0.99)
Globulin: 2.6 g/dL (calc) (ref 1.9–3.7)
Glucose, Bld: 89 mg/dL (ref 65–99)
Potassium: 4.2 mmol/L (ref 3.5–5.3)
Sodium: 140 mmol/L (ref 135–146)
Total Bilirubin: 0.5 mg/dL (ref 0.2–1.2)
Total Protein: 6.7 g/dL (ref 6.1–8.1)
eGFR: 107 mL/min/{1.73_m2} (ref >=60)

## 2022-03-08 LAB — CBC
HCT: 43 % (ref 35.0–45.0)
Hemoglobin: 14.6 g/dL (ref 11.7–15.5)
MCH: 29.7 pg (ref 27.0–33.0)
MCHC: 34 g/dL (ref 32.0–36.0)
MCV: 87.4 fL (ref 80.0–100.0)
MPV: 10.8 fL (ref 7.5–12.5)
Platelets: 330 10*3/uL (ref 140–400)
RBC: 4.92 10*6/uL (ref 3.80–5.10)
RDW: 11.9 % (ref 11.0–15.0)
WBC: 5.8 10*3/uL (ref 3.8–10.8)

## 2022-03-08 LAB — LIPID PANEL
Cholesterol: 183 mg/dL (ref <200)
HDL: 64 mg/dL (ref >=50)
LDL Cholesterol (Calc): 90 mg/dL (calc)
Non-HDL Cholesterol (Calc): 119 mg/dL (calc) (ref <130)
Total CHOL/HDL Ratio: 2.9 (calc) (ref <5.0)
Triglycerides: 197 mg/dL — ABNORMAL HIGH (ref <150)

## 2022-03-08 LAB — HEMOGLOBIN A1C
Hgb A1c MFr Bld: 5.4 % of total Hgb (ref <5.7)
Mean Plasma Glucose: 108 mg/dL
eAG (mmol/L): 6 mmol/L

## 2022-03-08 LAB — TSH: TSH: 1.56 mIU/L

## 2022-03-12 ENCOUNTER — Encounter: Payer: Self-pay | Admitting: Internal Medicine

## 2022-04-04 ENCOUNTER — Other Ambulatory Visit: Payer: Self-pay | Admitting: Internal Medicine

## 2022-04-04 DIAGNOSIS — F329 Major depressive disorder, single episode, unspecified: Secondary | ICD-10-CM

## 2022-04-04 NOTE — Telephone Encounter (Signed)
Requested Prescriptions  Pending Prescriptions Disp Refills   escitalopram (LEXAPRO) 20 MG tablet [Pharmacy Med Name: ESCITALOPRAM 20 MG TABLET] 90 tablet 0    Sig: TAKE 1 TABLET BY MOUTH EVERY DAY     Psychiatry:  Antidepressants - SSRI Passed - 04/04/2022  1:42 AM      Passed - Completed PHQ-2 or PHQ-9 in the last 360 days      Passed - Valid encounter within last 6 months    Recent Outpatient Visits           4 weeks ago Encounter for general adult medical examination with abnormal findings   [facility] [place], Coralie Keens, NP   7 months ago [facility] [place], Coralie Keens, NP   1 year ago Encounter for general adult medical examination with abnormal findings   [facility] [place], Coralie Keens, NP

## 2022-04-26 ENCOUNTER — Other Ambulatory Visit: Payer: Self-pay | Admitting: Internal Medicine

## 2022-04-28 NOTE — Telephone Encounter (Signed)
Requested Prescriptions  Pending Prescriptions Disp Refills   JUNEL FE 1/20 1-20 MG-MCG tablet [Pharmacy Med Name: JUNEL FE 1 MG-20 MCG TABLET] 84 tablet 3    Sig: TAKE 1 TABLET BY MOUTH EVERY DAY     OB/GYN:  Contraceptives Passed - 04/26/2022  9:40 PM      Passed - Last BP in normal range    BP Readings from Last 1 Encounters:  03/07/22 120/78         Passed - Valid encounter within last 12 months    Recent Outpatient Visits           1 month ago Encounter for general adult medical examination with abnormal findings   [place] [facility] [place], Salvadore Oxford, NP   8 months ago Hives   [facility] [place], Salvadore Oxford, NP   1 year ago Encounter for general adult medical examination with abnormal findings    [facility] [place], Salvadore Oxford, NP              Passed - Patient is not a smoker

## 2022-05-12 ENCOUNTER — Ambulatory Visit
Admission: RE | Admit: 2022-05-12 | Discharge: 2022-05-12 | Disposition: A | Discharge status: Home / Self Care | Payer: No Typology Code available for payment source | Source: Ambulatory Visit

## 2022-05-12 DIAGNOSIS — Z1231 Encounter for screening mammogram for malignant neoplasm of breast: Secondary | ICD-10-CM

## 2022-05-25 ENCOUNTER — Other Ambulatory Visit: Payer: Self-pay | Admitting: Internal Medicine

## 2022-05-26 NOTE — Telephone Encounter (Signed)
Requested Prescriptions  Pending Prescriptions Disp Refills   esomeprazole (NEXIUM) 20 MG capsule [Pharmacy Med Name: ESOMEPRAZOLE MAG DR 20 MG CAP] 90 capsule 2    Sig: TAKE 1 CAPSULE (20 MG TOTAL) BY MOUTH DAILY AT 12 NOON.     Gastroenterology: Proton Pump Inhibitors 2 Failed - 05/25/2022  8:32 AM      Failed - AST in normal range and within 360 days    AST  Date Value Ref Range Status  03/07/2022 8 (L) 10 - 30 U/L Final         Passed - ALT in normal range and within 360 days    ALT  Date Value Ref Range Status  03/07/2022 14 6 - 29 U/L Final         Passed - Valid encounter within last 12 months    Recent Outpatient Visits           2 months ago Encounter for general adult medical examination with abnormal findings   [place] [facility] [place], Salvadore Oxford, NP   8 months ago Hives   [facility] [place], Salvadore Oxford, NP   1 year ago Encounter for general adult medical examination with abnormal findings   [place] [facility] [place], Salvadore Oxford, NP

## 2022-06-06 ENCOUNTER — Ambulatory Visit: Payer: No Typology Code available for payment source | Admitting: Internal Medicine

## 2022-06-30 ENCOUNTER — Other Ambulatory Visit: Payer: Self-pay | Admitting: Internal Medicine

## 2022-06-30 DIAGNOSIS — F329 Major depressive disorder, single episode, unspecified: Secondary | ICD-10-CM

## 2022-07-01 NOTE — Telephone Encounter (Signed)
Requested Prescriptions  Pending Prescriptions Disp Refills   escitalopram (LEXAPRO) 20 MG tablet [Pharmacy Med Name: ESCITALOPRAM 20 MG TABLET] 90 tablet 0    Sig: TAKE 1 TABLET BY MOUTH EVERY DAY     Psychiatry:  Antidepressants - SSRI Passed - 06/30/2022  2:22 AM      Passed - Completed PHQ-2 or PHQ-9 in the last 360 days      Passed - Valid encounter within last 6 months    Recent Outpatient Visits           3 months ago Encounter for general adult medical examination with abnormal findings   [place] [facility] [place], Salvadore Oxford, NP   10 months ago Hives   [place] [facility] [place], Salvadore Oxford, NP   1 year ago Encounter for general adult medical examination with abnormal findings   [place] [facility] [place], Salvadore Oxford, NP

## 2022-11-05 ENCOUNTER — Other Ambulatory Visit: Payer: Self-pay | Admitting: Internal Medicine

## 2022-11-05 DIAGNOSIS — F329 Major depressive disorder, single episode, unspecified: Secondary | ICD-10-CM

## 2022-11-05 NOTE — Telephone Encounter (Signed)
Requested medication (s) are due for refill today: Yes  Requested medication (s) are on the active medication list: Yes  Last refill:  07/01/22  Future visit scheduled: No  Notes to clinic:  Protocol indicates pt. Needs OV. Pt. States she is seen yearly.    Requested Prescriptions  Pending Prescriptions Disp Refills   escitalopram (LEXAPRO) 20 MG tablet [Pharmacy Med Name: ESCITALOPRAM 20 MG TABLET] 90 tablet 0    Sig: TAKE 1 TABLET BY MOUTH EVERY DAY     Psychiatry:  Antidepressants - SSRI Failed - 11/05/2022  2:15 AM      Failed - Valid encounter within last 6 months    Recent Outpatient Visits           8 months ago Encounter for general adult medical examination with abnormal findings   [place] [facility] [place], Salvadore Oxford, NP   1 year ago Hives    [facility] [place], Salvadore Oxford, NP   1 year ago Encounter for general adult medical examination with abnormal findings    [facility] [place], Salvadore Oxford, NP              Passed - Completed PHQ-2 or PHQ-9 in the last 360 days

## 2022-11-06 ENCOUNTER — Other Ambulatory Visit: Payer: Self-pay | Admitting: Internal Medicine

## 2022-11-06 DIAGNOSIS — F329 Major depressive disorder, single episode, unspecified: Secondary | ICD-10-CM

## 2022-11-06 MED ORDER — ESCITALOPRAM OXALATE 20 MG PO TABS: 20.0000 mg | ORAL_TABLET | Freq: Every day | ORAL | 0 refills | Status: DC

## 2022-11-06 NOTE — Telephone Encounter (Signed)
Courtesy refill. Patient must keep upcoming appointment for further refills. Requested Prescriptions  Pending Prescriptions Disp Refills   escitalopram (LEXAPRO) 20 MG tablet 30 tablet 0    Sig: Take 1 tablet (20 mg total) by mouth daily.     Psychiatry:  Antidepressants - SSRI Failed - 11/06/2022  3:22 PM      Failed - Valid encounter within last 6 months    Recent Outpatient Visits           8 months ago Encounter for general adult medical examination with abnormal findings   [place] [facility], Salvadore Oxford, NP   1 year ago Hives   [place] [facility] [place], Salvadore Oxford, NP   1 year ago Encounter for general adult medical examination with abnormal findings   [place] [facility] [place], Salvadore Oxford, NP       Future Appointments             In 2 weeks [place], Salvadore Oxford, NP  [facility]            Passed - Completed PHQ-2 or PHQ-9 in the last 360 days

## 2022-11-06 NOTE — Telephone Encounter (Signed)
Medication Refill - Medication: escitalopram (LEXAPRO) 20 MG tablet [[id number]]   Has the patient contacted their pharmacy? Yes.     (Agent: If yes, when and what did the pharmacy advise?) Contact PCP   Preferred Pharmacy (with phone number or street name): [facility]   Has the patient been seen for an appointment in the last year OR does the patient have an upcoming appointment? Yes.    Agent: Please be advised that RX refills may take up to 3 business days. We ask that you follow-up with your pharmacy.    Pt is requesting enough to last until her appointment on 11/21/22. Pt is going out of town on the 22 of October and doesn't want to go without her medication.

## 2022-11-07 ENCOUNTER — Encounter: Payer: Self-pay | Admitting: Internal Medicine

## 2022-11-07 MED ORDER — ESCITALOPRAM OXALATE 20 MG PO TABS
20.0000 mg | ORAL_TABLET | Freq: Every day | ORAL | 0 refills | Status: DC
Start: 2022-11-07 — End: 2022-11-21

## 2022-11-21 ENCOUNTER — Ambulatory Visit: Payer: BC Managed Care – PPO | Admitting: Internal Medicine

## 2022-11-21 ENCOUNTER — Encounter: Payer: Self-pay | Admitting: Internal Medicine

## 2022-11-21 VITALS — BP 104/68 | Ht 63.5 in | Wt 170.0 lb

## 2022-11-21 DIAGNOSIS — Z6829 Body mass index (BMI) 29.0-29.9, adult: Secondary | ICD-10-CM

## 2022-11-21 DIAGNOSIS — E663 Overweight: Secondary | ICD-10-CM | POA: Diagnosis not present

## 2022-11-21 DIAGNOSIS — E782 Mixed hyperlipidemia: Secondary | ICD-10-CM | POA: Diagnosis not present

## 2022-11-21 DIAGNOSIS — K219 Gastro-esophageal reflux disease without esophagitis: Secondary | ICD-10-CM | POA: Diagnosis not present

## 2022-11-21 DIAGNOSIS — F329 Major depressive disorder, single episode, unspecified: Secondary | ICD-10-CM

## 2022-11-21 MED ORDER — NORETHIN ACE-ETH ESTRAD-FE 1-20 MG-MCG PO TABS: 1.0000 | ORAL_TABLET | Freq: Every day | ORAL | 1 refills | Status: DC

## 2022-11-21 MED ORDER — ESCITALOPRAM OXALATE 20 MG PO TABS: 20.0000 mg | ORAL_TABLET | Freq: Every day | ORAL | 1 refills | Status: DC

## 2022-11-21 MED ORDER — ESOMEPRAZOLE MAGNESIUM 20 MG PO CPDR: 20.0000 mg | DELAYED_RELEASE_CAPSULE | Freq: Every day | ORAL | 1 refills | Status: DC

## 2022-11-21 NOTE — Assessment & Plan Note (Signed)
Stable on current dose of escitalopram Support offered 

## 2022-11-21 NOTE — Progress Notes (Signed)
Subjective:    Patient ID: Kristina Hancock, female    DOB: January 19, 1981, 42 y.o.   MRN: [id number]  HPI  Patient presents to clinic today for follow-up of chronic conditions.  GERD: Triggered by.  She denies breakthrough on esomeprazole.  There is no upper GI on file.  Depression: Chronic, managed on escitalopram.  She is not currently seeing a therapist.  She denies anxiety, SI/HI.  HLD: Her last LDL was 90, triglycerides 409, 02/2022.  She is not taking any cholesterol-lowering medication at this time.  She tries to consume a low-fat diet.  Review of Systems     Past Medical History:  Diagnosis Date   Depression     Current Outpatient Medications  Medication Sig Dispense Refill   escitalopram (LEXAPRO) 20 MG tablet Take 1 tablet (20 mg total) by mouth daily. 90 tablet 0   esomeprazole (NEXIUM) 20 MG capsule TAKE 1 CAPSULE (20 MG TOTAL) BY MOUTH DAILY AT 12 NOON. 90 capsule 2   norethindrone-ethinyl estradiol-FE (JUNEL FE 1/20) 1-20 MG-MCG tablet TAKE 1 TABLET BY MOUTH EVERY DAY 84 tablet 3   No current facility-administered medications for this visit.    Allergies  Allergen Reactions   Hydrocodone Itching    Family History  Problem Relation Age of Onset   Diabetes type II Mother    Diabetes type II Father    Heart disease Father    Hypertension Brother    Ovarian cancer Maternal Grandmother    Heart disease Paternal Grandfather    Stroke Maternal Uncle    Diabetes Maternal Uncle     Social History   Socioeconomic History   Marital status: Single    Spouse name: Not on file   Number of children: Not on file   Years of education: Not on file   Highest education level: Not on file  Occupational History   Not on file  Tobacco Use   Smoking status: Former   Smokeless tobacco: Never   Tobacco comments:    quit 12 years ago  Vaping Use   Vaping status: Never Used  Substance and Sexual Activity   Alcohol use: Not Currently    Comment: rare   Drug use: Yes     Types: Marijuana   Sexual activity: Never  Other Topics Concern   Not on file  Social History Narrative   Not on file   Social Determinants of Health   Financial Resource Strain: Patient Declined (11/18/2022)   Overall Financial Resource Strain (CARDIA)    Difficulty of Paying Living Expenses: Patient declined  Food Insecurity: Patient Declined (11/18/2022)   Hunger Vital Sign    Worried About Running Out of Food in the Last Year: Patient declined    Ran Out of Food in the Last Year: Patient declined  Transportation Needs: Patient Declined (11/18/2022)   PRAPARE - Administrator, Civil Service (Medical): Patient declined    Lack of Transportation (Non-Medical): Patient declined  Physical Activity: Insufficiently Active (11/18/2022)   Exercise Vital Sign    Days of Exercise per Week: 4 days    Minutes of Exercise per Session: 30 min  Stress: No Stress Concern Present (11/18/2022)   [employer] of Occupational Health - Occupational Stress Questionnaire    Feeling of Stress : Only a little  Social Connections: Unknown (11/18/2022)   Social Connection and Isolation Panel [NHANES]    Frequency of Communication with Friends and Family: Patient declined    Frequency of Social Gatherings with  Friends and Family: Patient declined    Attends Religious Services: Patient declined    Database administrator or Organizations: Patient declined    Attends Engineer, structural: Not on file    Marital Status: Never married  Intimate Partner Violence: Not on file     Constitutional: Denies fever, malaise, fatigue, headache or abrupt weight changes.  HEENT: Denies eye pain, eye redness, ear pain, ringing in the ears, wax buildup, runny nose, nasal congestion, bloody nose, or sore throat. Respiratory: Denies difficulty breathing, shortness of breath, cough or sputum production.   Cardiovascular: Denies chest pain, chest tightness, palpitations or swelling in the hands  or feet.  Gastrointestinal: Denies abdominal pain, bloating, constipation, diarrhea or blood in the stool.  GU: Denies urgency, frequency, pain with urination, burning sensation, blood in urine, odor or discharge. Musculoskeletal: Denies decrease in range of motion, difficulty with gait, muscle pain or joint pain and swelling.  Skin: Denies redness, rashes, lesions or ulcercations.  Neurological: Denies dizziness, difficulty with memory, difficulty with speech or problems with balance and coordination.  Psych: Patient has a history of depression.  Denies anxiety, SI/HI.  No other specific complaints in a complete review of systems (except as listed in HPI above).  Objective:   Physical Exam   BP 104/68   Ht 5' 3.5" (1.613 m)   Wt 170 lb (77.1 kg)   BMI 29.64 kg/m   Wt Readings from Last 3 Encounters:  03/07/22 184 lb (83.5 kg)  03/04/21 176 lb (79.8 kg)  01/12/20 167 lb (75.8 kg)    General: Appears her stated age, overweight, in NAD. Skin: Warm, dry and intact.  HEENT: Head: normal shape and size; Eyes: sclera white, no icterus, conjunctiva pink, PERRLA and EOMs intact;  Cardiovascular: Normal rate and rhythm. S1,S2 noted.  No murmur, rubs or gallops noted. No JVD or BLE edema.  Pulmonary/Chest: Normal effort and positive vesicular breath sounds. No respiratory distress. No wheezes, rales or ronchi noted.  Abdomen: Soft and nontender. Normal bowel sounds.  Musculoskeletal:  No difficulty with gait.  Neurological: Alert and oriented. Cranial nerves II-XII grossly intact. Coordination normal.  Psychiatric: Mood and affect normal. Behavior is normal. Judgment and thought content normal.     BMET    Component Value Date/Time   NA 140 03/07/2022 0944   NA 140 09/14/2015 1454   K 4.2 03/07/2022 0944   CL 106 03/07/2022 0944   CO2 24 03/07/2022 0944   GLUCOSE 89 03/07/2022 0944   BUN 10 03/07/2022 0944   BUN 11 09/14/2015 1454   CREATININE 0.72 03/07/2022 0944   CALCIUM  9.2 03/07/2022 0944   GFRNONAA 104 09/14/2015 1454   GFRAA 119 09/14/2015 1454    Lipid Panel     Component Value Date/Time   CHOL 183 03/07/2022 0944   TRIG 197 (H) 03/07/2022 0944   HDL 64 03/07/2022 0944   CHOLHDL 2.9 03/07/2022 0944   VLDL 36.4 01/12/2020 0847   LDLCALC 90 03/07/2022 0944    CBC    Component Value Date/Time   WBC 5.8 03/07/2022 0944   RBC 4.92 03/07/2022 0944   HGB 14.6 03/07/2022 0944   HGB 14.4 09/14/2015 1454   HCT 43.0 03/07/2022 0944   HCT 42.7 09/14/2015 1454   PLT 330 03/07/2022 0944   PLT 328 09/14/2015 1454   MCV 87.4 03/07/2022 0944   MCV 86 09/14/2015 1454   MCH 29.7 03/07/2022 0944   MCHC 34.0 03/07/2022 0944   RDW  11.9 03/07/2022 0944   RDW 12.9 09/14/2015 1454   LYMPHSABS 2.4 09/14/2015 1454   EOSABS 0.2 09/14/2015 1454   BASOSABS 0.0 09/14/2015 1454    Hgb A1C Lab Results  Component Value Date   HGBA1C 5.4 03/07/2022           Assessment & Plan:    RTC in 3 months for your annual exam Nicki Reaper, NP

## 2022-11-21 NOTE — Assessment & Plan Note (Signed)
Will check lipid profile with annual exam Encouraged low fat diet

## 2022-11-21 NOTE — Assessment & Plan Note (Signed)
Avoid foods that trigger your reflux Encouraged weight loss as this can help reduce reflux symptoms Continue esomeprazole

## 2022-11-21 NOTE — Assessment & Plan Note (Signed)
Encouraged diet and exercise for weight loss ?

## 2022-11-21 NOTE — Patient Instructions (Signed)

## 2023-03-13 ENCOUNTER — Ambulatory Visit (INDEPENDENT_AMBULATORY_CARE_PROVIDER_SITE_OTHER): Payer: BC Managed Care – PPO | Admitting: Internal Medicine

## 2023-03-13 VITALS — BP 102/64 | Ht 63.5 in | Wt 168.6 lb

## 2023-03-13 DIAGNOSIS — E782 Mixed hyperlipidemia: Secondary | ICD-10-CM | POA: Diagnosis not present

## 2023-03-13 DIAGNOSIS — Z1231 Encounter for screening mammogram for malignant neoplasm of breast: Secondary | ICD-10-CM | POA: Diagnosis not present

## 2023-03-13 DIAGNOSIS — Z6829 Body mass index (BMI) 29.0-29.9, adult: Secondary | ICD-10-CM

## 2023-03-13 DIAGNOSIS — E663 Overweight: Secondary | ICD-10-CM

## 2023-03-13 DIAGNOSIS — R739 Hyperglycemia, unspecified: Secondary | ICD-10-CM

## 2023-03-13 DIAGNOSIS — Z0001 Encounter for general adult medical examination with abnormal findings: Secondary | ICD-10-CM

## 2023-03-13 NOTE — Patient Instructions (Signed)

## 2023-03-13 NOTE — Progress Notes (Signed)
Subjective:    Patient ID: Kristina Hancock, female    DOB: 07/24/1980, 43 y.o.   MRN: [id number]  HPI  Patient presents to clinic today for her annual exam.  Flu: 10/2018 Tetanus: 02/2022 COVID: X 4 Pap smear: 12/2019 Mammogram: 04/2022 Vision screening: as needed Dentist: biannually  Diet: She does eat some meat. She consumes fruits and veggies. She does eat some fried foods. She drinks mostly water and tea. Exercise: Walking  Review of Systems     Past Medical History:  Diagnosis Date   Depression     Current Outpatient Medications  Medication Sig Dispense Refill   escitalopram (LEXAPRO) 20 MG tablet Take 1 tablet (20 mg total) by mouth daily. 90 tablet 1   esomeprazole (NEXIUM) 20 MG capsule Take 1 capsule (20 mg total) by mouth daily at 12 noon. 90 capsule 1   norethindrone-ethinyl estradiol-FE (JUNEL FE 1/20) 1-20 MG-MCG tablet Take 1 tablet by mouth daily. 84 tablet 1   No current facility-administered medications for this visit.    Allergies  Allergen Reactions   Hydrocodone Itching    Family History  Problem Relation Age of Onset   Diabetes type II Mother    Diabetes type II Father    Heart disease Father    Hypertension Brother    Ovarian cancer Maternal Grandmother    Heart disease Paternal Grandfather    Stroke Maternal Uncle    Diabetes Maternal Uncle     Social History   Socioeconomic History   Marital status: Single    Spouse name: Not on file   Number of children: Not on file   Years of education: Not on file   Highest education level: Associate degree: occupational, Scientist, product/process development, or vocational program  Occupational History   Not on file  Tobacco Use   Smoking status: Former   Smokeless tobacco: Never   Tobacco comments:    quit 12 years ago  Vaping Use   Vaping status: Never Used  Substance and Sexual Activity   Alcohol use: Not Currently    Comment: rare   Drug use: Yes    Types: Marijuana   Sexual activity: Never  Other Topics  Concern   Not on file  Social History Narrative   Not on file   Social Drivers of Health   Financial Resource Strain: Low Risk  (03/09/2023)   Overall Financial Resource Strain (CARDIA)    Difficulty of Paying Living Expenses: Not hard at all  Food Insecurity: No Food Insecurity (03/09/2023)   Hunger Vital Sign    Worried About Running Out of Food in the Last Year: Never true    Ran Out of Food in the Last Year: Never true  Transportation Needs: No Transportation Needs (03/09/2023)   PRAPARE - Administrator, Civil Service (Medical): No    Lack of Transportation (Non-Medical): No  Physical Activity: Insufficiently Active (03/09/2023)   Exercise Vital Sign    Days of Exercise per Week: 3 days    Minutes of Exercise per Session: 20 min  Stress: No Stress Concern Present (03/09/2023)   [employer] of Occupational Health - Occupational Stress Questionnaire    Feeling of Stress : Not at all  Social Connections: Moderately Integrated (03/09/2023)   Social Connection and Isolation Panel [NHANES]    Frequency of Communication with Friends and Family: Three times a week    Frequency of Social Gatherings with Friends and Family: Once a week    Attends Religious Services: More  than 4 times per year    Active Member of Clubs or Organizations: Yes    Attends Banker Meetings: More than 4 times per year    Marital Status: Never married  Intimate Partner Violence: Not on file     Constitutional: Denies fever, malaise, fatigue, headache or abrupt weight changes.  HEENT: Denies eye pain, eye redness, ear pain, ringing in the ears, wax buildup, runny nose, nasal congestion, bloody nose, or sore throat. Respiratory: Denies difficulty breathing, shortness of breath, cough or sputum production.   Cardiovascular: Denies chest pain, chest tightness, palpitations or swelling in the hands or feet.  Gastrointestinal: Pt reports intermittent reflux. Denies abdominal pain,  bloating, constipation, diarrhea or blood in the stool.  GU: Denies urgency, frequency, pain with urination, burning sensation, blood in urine, odor or discharge. Musculoskeletal: Denies decrease in range of motion, difficulty with gait, muscle pain or joint pain and swelling.  Skin: Denies redness, rashes, lesions or ulcercations.  Neurological: Denies dizziness, difficulty with memory, difficulty with speech or problems with balance and coordination.  Psych: Patient has a history of depression.  Denies anxiety, SI/HI.  No other specific complaints in a complete review of systems (except as listed in HPI above).  Objective:   Physical Exam BP 102/64 (BP Location: Left Arm, Patient Position: Sitting, Cuff Size: Normal)   Ht 5' 3.5" (1.613 m)   Wt 168 lb 9.6 oz (76.5 kg)   LMP 02/27/2023 (Exact Date)   BMI 29.40 kg/m    Wt Readings from Last 3 Encounters:  11/21/22 170 lb (77.1 kg)  03/07/22 184 lb (83.5 kg)  03/04/21 176 lb (79.8 kg)    General: Appears her stated age, overweight, in NAD. Skin: Warm, dry and intact. No rashes noted. HEENT: Head: normal shape and size; Eyes: sclera white, no icterus, conjunctiva pink, PERRLA and EOMs intact;  Neck:  Neck supple, trachea midline. No masses, lumps or thyromegaly present.  Cardiovascular: Normal rate and rhythm. S1,S2 noted.  No murmur, rubs or gallops noted. No JVD or BLE edema. Pulmonary/Chest: Normal effort and positive vesicular breath sounds. No respiratory distress. No wheezes, rales or ronchi noted.  Abdomen: Normal bowel sounds. Musculoskeletal: Strength 5/5 BUE/BLE. No difficulty with gait.  Neurological: Alert and oriented. Cranial nerves II-XII grossly intact. Coordination normal.  Psychiatric: Mood and affect normal. Behavior is normal. Judgment and thought content normal.    BMET    Component Value Date/Time   NA 140 03/07/2022 0944   NA 140 09/14/2015 1454   K 4.2 03/07/2022 0944   CL 106 03/07/2022 0944   CO2 24  03/07/2022 0944   GLUCOSE 89 03/07/2022 0944   BUN 10 03/07/2022 0944   BUN 11 09/14/2015 1454   CREATININE 0.72 03/07/2022 0944   CALCIUM 9.2 03/07/2022 0944   GFRNONAA 104 09/14/2015 1454   GFRAA 119 09/14/2015 1454    Lipid Panel     Component Value Date/Time   CHOL 183 03/07/2022 0944   TRIG 197 (H) 03/07/2022 0944   HDL 64 03/07/2022 0944   CHOLHDL 2.9 03/07/2022 0944   VLDL 36.4 01/12/2020 0847   LDLCALC 90 03/07/2022 0944    CBC    Component Value Date/Time   WBC 5.8 03/07/2022 0944   RBC 4.92 03/07/2022 0944   HGB 14.6 03/07/2022 0944   HGB 14.4 09/14/2015 1454   HCT 43.0 03/07/2022 0944   HCT 42.7 09/14/2015 1454   PLT 330 03/07/2022 0944   PLT 328 09/14/2015 1454  MCV 87.4 03/07/2022 0944   MCV 86 09/14/2015 1454   MCH 29.7 03/07/2022 0944   MCHC 34.0 03/07/2022 0944   RDW 11.9 03/07/2022 0944   RDW 12.9 09/14/2015 1454   LYMPHSABS 2.4 09/14/2015 1454   EOSABS 0.2 09/14/2015 1454   BASOSABS 0.0 09/14/2015 1454    Hgb A1C Lab Results  Component Value Date   HGBA1C 5.4 03/07/2022           Assessment & Plan:   Preventative Health Maintenance:  Flu shot declined Tetanus UTD Encouraged her to get her COVID booster Pap smear UTD Mammogram ordered-she will call to schedule Encouraged her to consume a balanced diet and exercise regimen Advised her to see an eye doctor and dentist annually We will check CBC, c-Met, lipid, A1c today  RTC in 6 months, follow-up chronic conditions Nicki Reaper, NP

## 2023-03-13 NOTE — Assessment & Plan Note (Signed)
 Encouraged diet and exercise for weight loss ?

## 2023-03-14 ENCOUNTER — Encounter: Payer: Self-pay | Admitting: Internal Medicine

## 2023-03-14 LAB — LIPID PANEL
Cholesterol: 193 mg/dL (ref <200)
HDL: 74 mg/dL (ref >=50)
LDL Cholesterol (Calc): 96 mg/dL
Non-HDL Cholesterol (Calc): 119 mg/dL (ref <130)
Total CHOL/HDL Ratio: 2.6 (calc) (ref <5.0)
Triglycerides: 130 mg/dL (ref <150)

## 2023-03-14 LAB — COMPLETE METABOLIC PANEL WITH GFR
AG Ratio: 2 (calc) (ref 1.0–2.5)
ALT: 14 U/L (ref 6–29)
AST: 10 U/L (ref 10–30)
Albumin: 4.3 g/dL (ref 3.6–5.1)
Alkaline phosphatase (APISO): 58 U/L (ref 31–125)
BUN: 11 mg/dL (ref 7–25)
CO2: 28 mmol/L (ref 20–32)
Calcium: 9.3 mg/dL (ref 8.6–10.2)
Chloride: 105 mmol/L (ref 98–110)
Creat: 0.83 mg/dL (ref 0.50–0.99)
Globulin: 2.2 g/dL (ref 1.9–3.7)
Glucose, Bld: 88 mg/dL (ref 65–99)
Potassium: 3.9 mmol/L (ref 3.5–5.3)
Sodium: 140 mmol/L (ref 135–146)
Total Bilirubin: 0.6 mg/dL (ref 0.2–1.2)
Total Protein: 6.5 g/dL (ref 6.1–8.1)
eGFR: 90 mL/min/{1.73_m2} (ref >=60)

## 2023-03-14 LAB — CBC
HCT: 44.2 % (ref 35.0–45.0)
Hemoglobin: 14.7 g/dL (ref 11.7–15.5)
MCH: 29.9 pg (ref 27.0–33.0)
MCHC: 33.3 g/dL (ref 32.0–36.0)
MCV: 90 fL (ref 80.0–100.0)
MPV: 10.7 fL (ref 7.5–12.5)
Platelets: 322 10*3/uL (ref 140–400)
RBC: 4.91 10*6/uL (ref 3.80–5.10)
RDW: 11.8 % (ref 11.0–15.0)
WBC: 4.9 10*3/uL (ref 3.8–10.8)

## 2023-03-14 LAB — HEMOGLOBIN A1C
Hgb A1c MFr Bld: 5.2 %{Hb} (ref <5.7)
Mean Plasma Glucose: 103 mg/dL
eAG (mmol/L): 5.7 mmol/L

## 2023-06-10 DIAGNOSIS — M20012 Mallet finger of left finger(s): Secondary | ICD-10-CM | POA: Diagnosis not present

## 2023-06-12 ENCOUNTER — Ambulatory Visit
Admission: RE | Admit: 2023-06-12 | Discharge: 2023-06-12 | Disposition: A | Discharge status: Home / Self Care | Source: Ambulatory Visit | Attending: Internal Medicine | Admitting: Internal Medicine

## 2023-06-12 DIAGNOSIS — Z1231 Encounter for screening mammogram for malignant neoplasm of breast: Secondary | ICD-10-CM | POA: Insufficient documentation

## 2023-07-16 ENCOUNTER — Other Ambulatory Visit: Payer: Self-pay | Admitting: Internal Medicine

## 2023-07-17 NOTE — Telephone Encounter (Signed)
 Requested Prescriptions  Pending Prescriptions Disp Refills   norethindrone-ethinyl estradiol-FE (JUNEL FE 1/20) 1-20 MG-MCG tablet [Pharmacy Med Name: JUNEL FE 1 MG-20 MCG TABLET] 84 tablet 0    Sig: TAKE 1 TABLET BY MOUTH EVERY DAY     OB/GYN:  Contraceptives Passed - 07/17/2023 11:30 AM      Passed - Last BP in normal range    BP Readings from Last 1 Encounters:  03/13/23 102/64         Passed - Valid encounter within last 12 months    Recent Outpatient Visits           4 months ago Encounter for general adult medical examination with abnormal findings   [place] [facility] [place], Angeline ORN, NP       Future Appointments             In 1 month [place], Angeline ORN, NP [place] [facility], [facility]            Passed - Patient is not a smoker

## 2023-08-09 ENCOUNTER — Other Ambulatory Visit: Payer: Self-pay | Admitting: Internal Medicine

## 2023-08-09 DIAGNOSIS — F329 Major depressive disorder, single episode, unspecified: Secondary | ICD-10-CM

## 2023-08-11 NOTE — Telephone Encounter (Signed)
 Requested Prescriptions  Pending Prescriptions Disp Refills   escitalopram  (LEXAPRO ) 20 MG tablet [Pharmacy Med Name: ESCITALOPRAM  20 MG TABLET] 90 tablet 0    Sig: TAKE 1 TABLET BY MOUTH EVERY DAY     Psychiatry:  Antidepressants - SSRI Passed - 08/11/2023  1:37 PM      Passed - Completed PHQ-2 or PHQ-9 in the last 360 days      Passed - Valid encounter within last 6 months    Recent Outpatient Visits           5 months ago Encounter for general adult medical examination with abnormal findings   [place] [facility] [place], Angeline ORN, NP       Future Appointments             In 1 month [place], Angeline ORN, NP [facility], [facility]

## 2023-09-11 ENCOUNTER — Ambulatory Visit: Payer: BC Managed Care – PPO | Admitting: Internal Medicine

## 2023-09-15 ENCOUNTER — Ambulatory Visit: Admitting: Internal Medicine

## 2023-09-15 ENCOUNTER — Encounter: Payer: Self-pay | Admitting: Internal Medicine

## 2023-09-15 VITALS — BP 110/68 | Ht 63.5 in | Wt 168.8 lb

## 2023-09-15 DIAGNOSIS — F329 Major depressive disorder, single episode, unspecified: Secondary | ICD-10-CM

## 2023-09-15 DIAGNOSIS — K219 Gastro-esophageal reflux disease without esophagitis: Secondary | ICD-10-CM

## 2023-09-15 DIAGNOSIS — E663 Overweight: Secondary | ICD-10-CM | POA: Diagnosis not present

## 2023-09-15 DIAGNOSIS — E782 Mixed hyperlipidemia: Secondary | ICD-10-CM | POA: Diagnosis not present

## 2023-09-15 DIAGNOSIS — Z6829 Body mass index (BMI) 29.0-29.9, adult: Secondary | ICD-10-CM

## 2023-09-15 MED ORDER — ESOMEPRAZOLE MAGNESIUM 20 MG PO CPDR: 20.0000 mg | DELAYED_RELEASE_CAPSULE | Freq: Every day | ORAL | 1 refills | Status: AC

## 2023-09-15 MED ORDER — NORETHIN ACE-ETH ESTRAD-FE 1-20 MG-MCG PO TABS: 1.0000 | ORAL_TABLET | Freq: Every day | ORAL | 1 refills | Status: AC

## 2023-09-15 MED ORDER — ESCITALOPRAM OXALATE 20 MG PO TABS
20.0000 mg | ORAL_TABLET | Freq: Every day | ORAL | 1 refills | Status: AC
Start: 2023-09-15 — End: ?

## 2023-09-15 NOTE — Progress Notes (Unsigned)
 Subjective:    Patient ID: Kristina Hancock, female    DOB: [birth year], 43 y.o.   MRN: [id number]  HPI  Patient presents to clinic today for follow-up of chronic conditions.  GERD: She is not sure what triggers this.  She denies breakthrough on esomeprazole . She has tried to wean that in the past.  There is no upper GI on file.  Depression: Chronic, managed on escitalopram .  She is not currently seeing a therapist.  She denies anxiety, SI/HI.  HLD: Her last LDL was 96, triglycerides 869, 02/2023.  She is not taking any cholesterol-lowering medication at this time.  She tries to consume a low-fat diet.  Review of Systems     Past Medical History:  Diagnosis Date   Depression     Current Outpatient Medications  Medication Sig Dispense Refill   escitalopram  (LEXAPRO ) 20 MG tablet TAKE 1 TABLET BY MOUTH EVERY DAY 90 tablet 0   esomeprazole  (NEXIUM ) 20 MG capsule Take 1 capsule (20 mg total) by mouth daily at 12 noon. 90 capsule 1   norethindrone-ethinyl estradiol-FE (JUNEL FE 1/20) 1-20 MG-MCG tablet TAKE 1 TABLET BY MOUTH EVERY DAY 84 tablet 0   No current facility-administered medications for this visit.    Allergies  Allergen Reactions   Hydrocodone Itching    Family History  Problem Relation Age of Onset   Diabetes type II Mother    Diabetes type II Father    Heart disease Father    Stroke Maternal Uncle    Diabetes Maternal Uncle    Ovarian cancer Maternal Grandmother    Heart disease Paternal Grandfather    Hypertension Brother    Breast cancer Neg Hx     Social History   Socioeconomic History   Marital status: Single    Spouse name: Not on file   Number of children: Not on file   Years of education: Not on file   Highest education level: Associate degree: occupational, Scientist, product/process development, or vocational program  Occupational History   Not on file  Tobacco Use   Smoking status: Former   Smokeless tobacco: Never   Tobacco comments:    quit 12 years ago  Vaping Use    Vaping status: Never Used  Substance and Sexual Activity   Alcohol use: Not Currently    Comment: rare   Drug use: Yes    Types: Marijuana   Sexual activity: Never  Other Topics Concern   Not on file  Social History Narrative   Not on file   Social Drivers of Health   Financial Resource Strain: Low Risk  (09/11/2023)   Overall Financial Resource Strain (CARDIA)    Difficulty of Paying Living Expenses: Not very hard  Food Insecurity: No Food Insecurity (09/11/2023)   Hunger Vital Sign    Worried About Running Out of Food in the Last Year: Never true    Ran Out of Food in the Last Year: Never true  Transportation Needs: No Transportation Needs (09/11/2023)   PRAPARE - Administrator, Civil Service (Medical): No    Lack of Transportation (Non-Medical): No  Physical Activity: Insufficiently Active (09/11/2023)   Exercise Vital Sign    Days of Exercise per Week: 4 days    Minutes of Exercise per Session: 20 min  Stress: No Stress Concern Present (09/11/2023)   [employer] of Occupational Health - Occupational Stress Questionnaire    Feeling of Stress: Not at all  Social Connections: Moderately Integrated (09/11/2023)   Social  Connection and Isolation Panel    Frequency of Communication with Friends and Family: More than three times a week    Frequency of Social Gatherings with Friends and Family: More than three times a week    Attends Religious Services: More than 4 times per year    Active Member of [employer] or Organizations: Yes    Attends Engineer, structural: More than 4 times per year    Marital Status: Never married  Intimate Partner Violence: Not on file     Constitutional: Denies fever, malaise, fatigue, headache or abrupt weight changes.  HEENT: Denies eye pain, eye redness, ear pain, ringing in the ears, wax buildup, runny nose, nasal congestion, bloody nose, or sore throat. Respiratory: Denies difficulty breathing, shortness of breath, cough or  sputum production.   Cardiovascular: Denies chest pain, chest tightness, palpitations or swelling in the hands or feet.  Gastrointestinal: Denies abdominal pain, bloating, constipation, diarrhea or blood in the stool.  GU: Denies urgency, frequency, pain with urination, burning sensation, blood in urine, odor or discharge. Musculoskeletal: Denies decrease in range of motion, difficulty with gait, muscle pain or joint pain and swelling.  Skin: Denies redness, rashes, lesions or ulcercations.  Neurological: Denies dizziness, difficulty with memory, difficulty with speech or problems with balance and coordination.  Psych: Patient has a history of depression.  Denies anxiety, SI/HI.  No other specific complaints in a complete review of systems (except as listed in HPI above).  Objective:   Physical Exam   There were no vitals taken for this visit.  Wt Readings from Last 3 Encounters:  03/13/23 168 lb 9.6 oz (76.5 kg)  11/21/22 170 lb (77.1 kg)  03/07/22 184 lb (83.5 kg)    General: Appears her stated age, overweight, in NAD. Skin: Warm, dry and intact.  HEENT: Head: normal shape and size; Eyes: sclera white, no icterus, conjunctiva pink, PERRLA and EOMs intact;  Cardiovascular: Normal rate and rhythm. S1,S2 noted.  No murmur, rubs or gallops noted. No JVD or BLE edema.  Pulmonary/Chest: Normal effort and positive vesicular breath sounds. No respiratory distress. No wheezes, rales or ronchi noted.  Abdomen: Soft and nontender. Normal bowel sounds.  Musculoskeletal:  No difficulty with gait.  Neurological: Alert and oriented. Cranial nerves II-XII grossly intact. Coordination normal.  Psychiatric: Mood and affect normal. Behavior is normal. Judgment and thought content normal.     BMET    Component Value Date/Time   NA 140 03/13/2023 0956   NA 140 09/14/2015 1454   K 3.9 03/13/2023 0956   CL 105 03/13/2023 0956   CO2 28 03/13/2023 0956   GLUCOSE 88 03/13/2023 0956   BUN 11  03/13/2023 0956   BUN 11 09/14/2015 1454   CREATININE 0.83 03/13/2023 0956   CALCIUM 9.3 03/13/2023 0956   GFRNONAA 104 09/14/2015 1454   GFRAA 119 09/14/2015 1454    Lipid Panel     Component Value Date/Time   CHOL 193 03/13/2023 0956   TRIG 130 03/13/2023 0956   HDL 74 03/13/2023 0956   CHOLHDL 2.6 03/13/2023 0956   VLDL 36.4 01/12/2020 0847   LDLCALC 96 03/13/2023 0956    CBC    Component Value Date/Time   WBC 4.9 03/13/2023 0956   RBC 4.91 03/13/2023 0956   HGB 14.7 03/13/2023 0956   HGB 14.4 09/14/2015 1454   HCT 44.2 03/13/2023 0956   HCT 42.7 09/14/2015 1454   PLT 322 03/13/2023 0956   PLT 328 09/14/2015 1454  MCV 90.0 03/13/2023 0956   MCV 86 09/14/2015 1454   MCH 29.9 03/13/2023 0956   MCHC 33.3 03/13/2023 0956   RDW 11.8 03/13/2023 0956   RDW 12.9 09/14/2015 1454   LYMPHSABS 2.4 09/14/2015 1454   EOSABS 0.2 09/14/2015 1454   BASOSABS 0.0 09/14/2015 1454    Hgb A1C Lab Results  Component Value Date   HGBA1C 5.2 03/13/2023           Assessment & Plan:    RTC in 6 months for your annual exam Angeline Laura, NP

## 2023-09-16 ENCOUNTER — Encounter: Payer: Self-pay | Admitting: Internal Medicine

## 2023-09-16 NOTE — Patient Instructions (Signed)

## 2023-09-16 NOTE — Assessment & Plan Note (Signed)
Will check lipid profile with annual exam Encouraged low fat diet

## 2023-09-16 NOTE — Assessment & Plan Note (Signed)
 Encouraged diet and exercise for weight loss ?

## 2023-09-16 NOTE — Assessment & Plan Note (Signed)
 Stable on escitalopram  20 mg daily Support offered

## 2023-09-16 NOTE — Assessment & Plan Note (Signed)
 Avoid foods that trigger your reflux Encouraged weight loss as this can help reduce reflux symptoms Continue esomeprazole  20 mg daily

## 2023-11-05 ENCOUNTER — Encounter: Payer: Self-pay | Admitting: Internal Medicine

## 2023-12-11 ENCOUNTER — Ambulatory Visit: Admitting: Internal Medicine

## 2024-03-18 ENCOUNTER — Encounter: Admitting: Internal Medicine

## 2024-03-23 IMAGING — MG MM DIGITAL SCREENING BILAT W/ TOMO AND CAD
6 of 10 series · 6 of 30 positions shown · non-contrast
Comparison: None.

CLINICAL DATA: Screening.

EXAM:
DIGITAL SCREENING BILATERAL MAMMOGRAM WITH TOMOSYNTHESIS AND CAD
TECHNIQUE: Bilateral screening digital craniocaudal and mediolateral oblique
mammograms were obtained. Bilateral screening digital breast
tomosynthesis was performed. The images were evaluated with
computer-aided detection.

[R MLO synth-2D (1 of 2)]
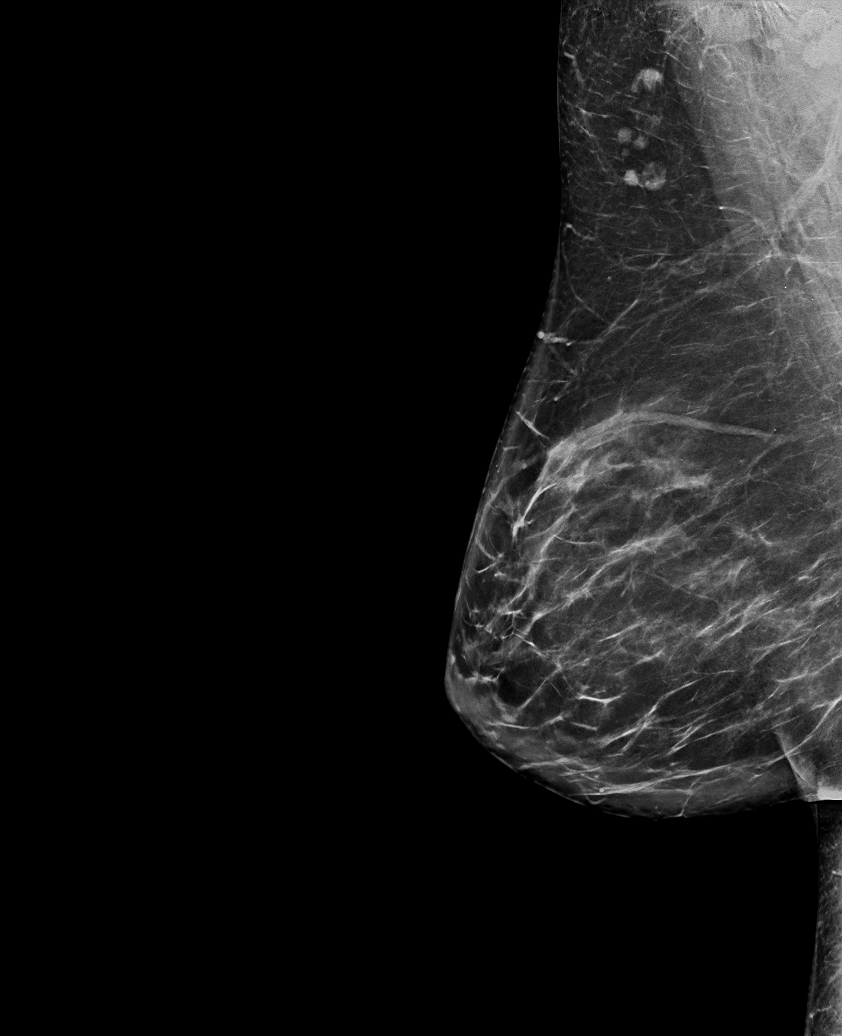

[R MLO synth-2D (2 of 2)]
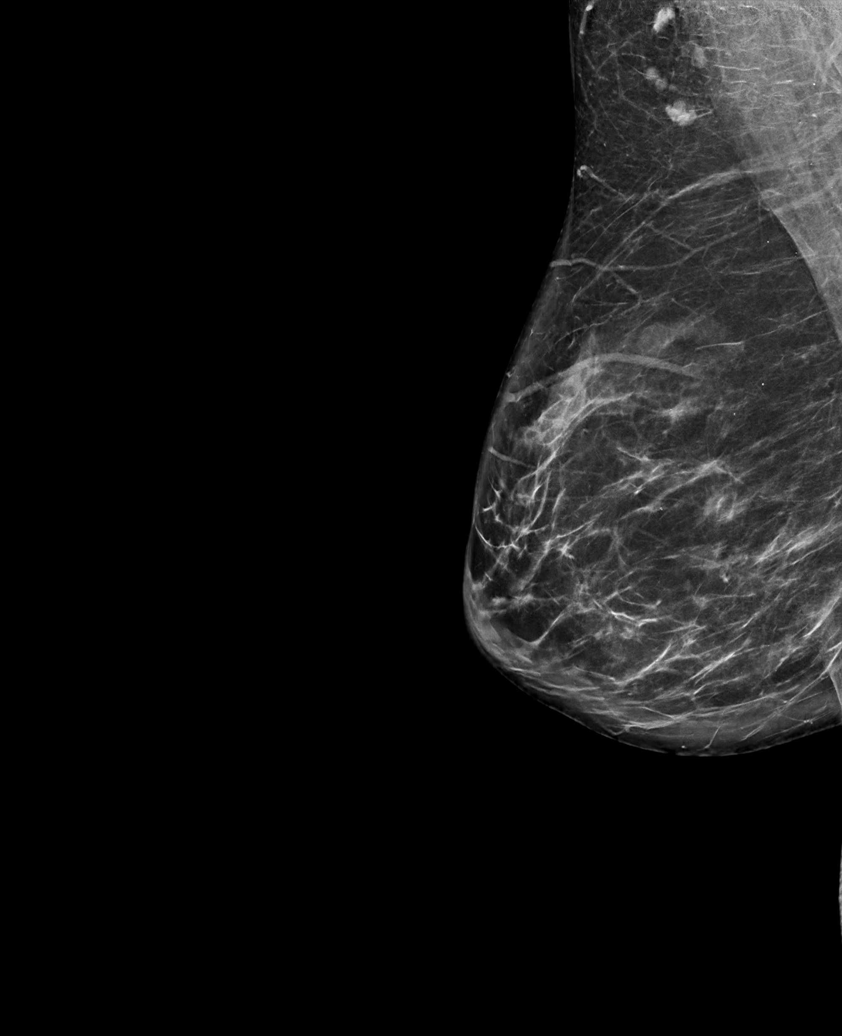

[L CC synth-2D]
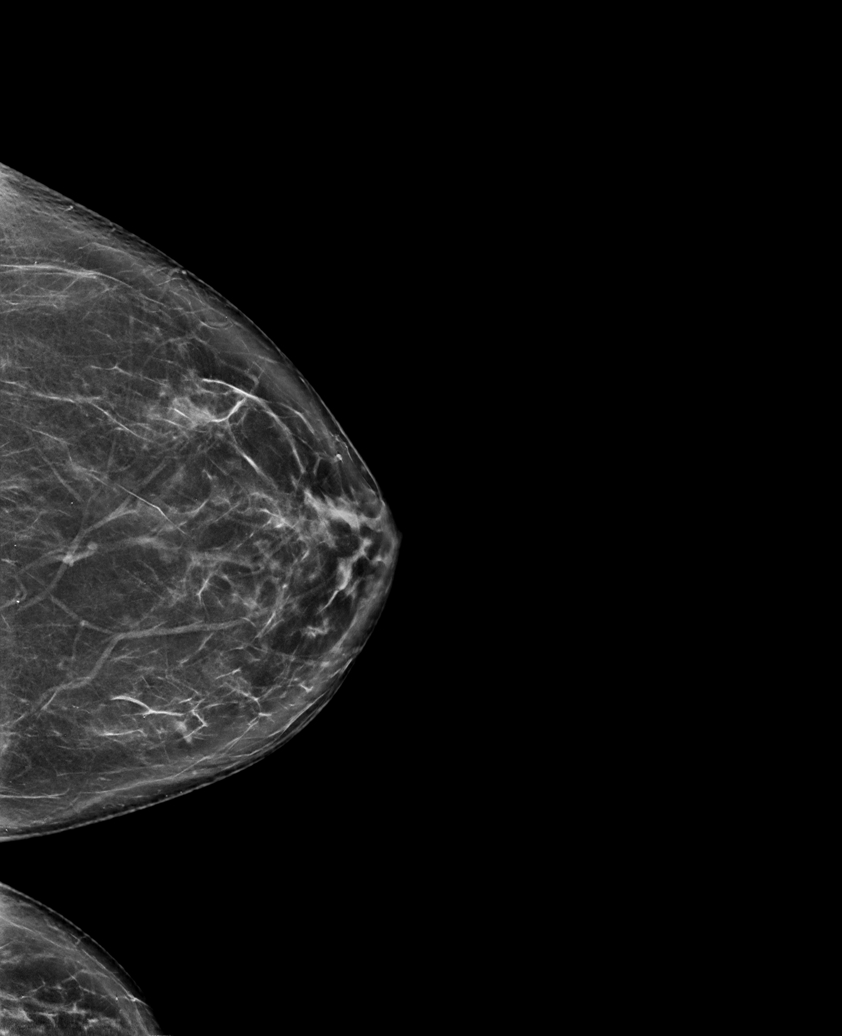

[L MLO synth-2D]
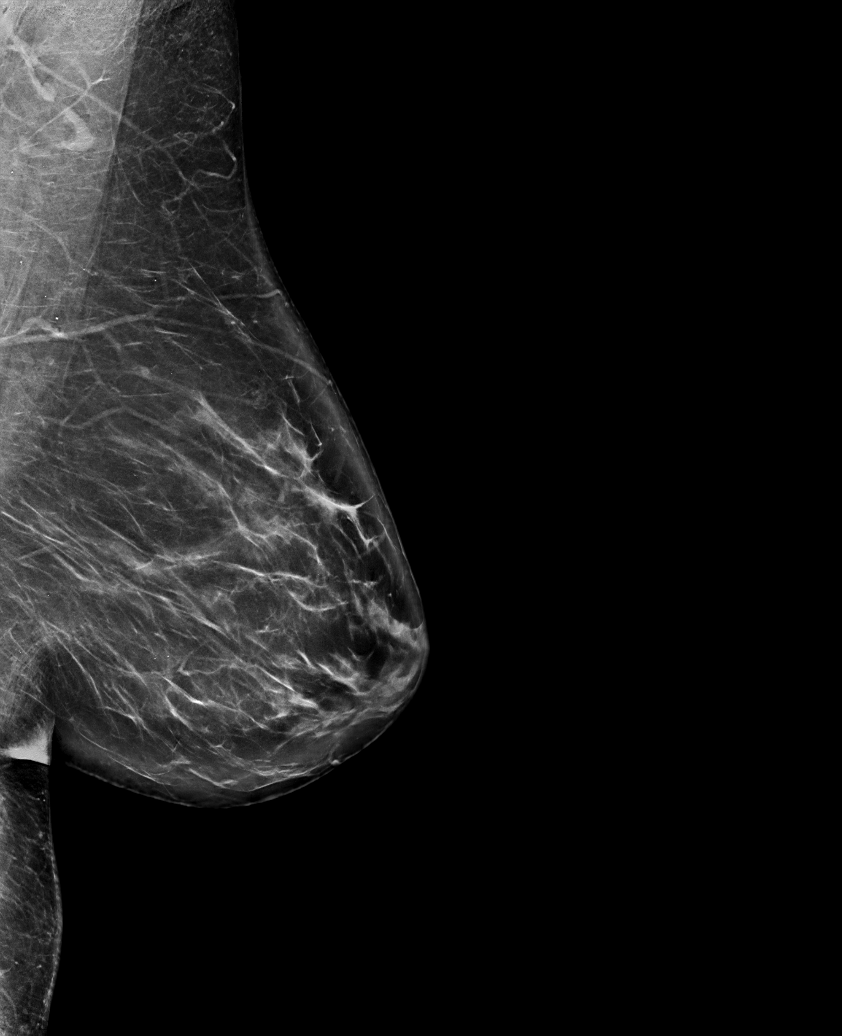

[R CC synth-2D]
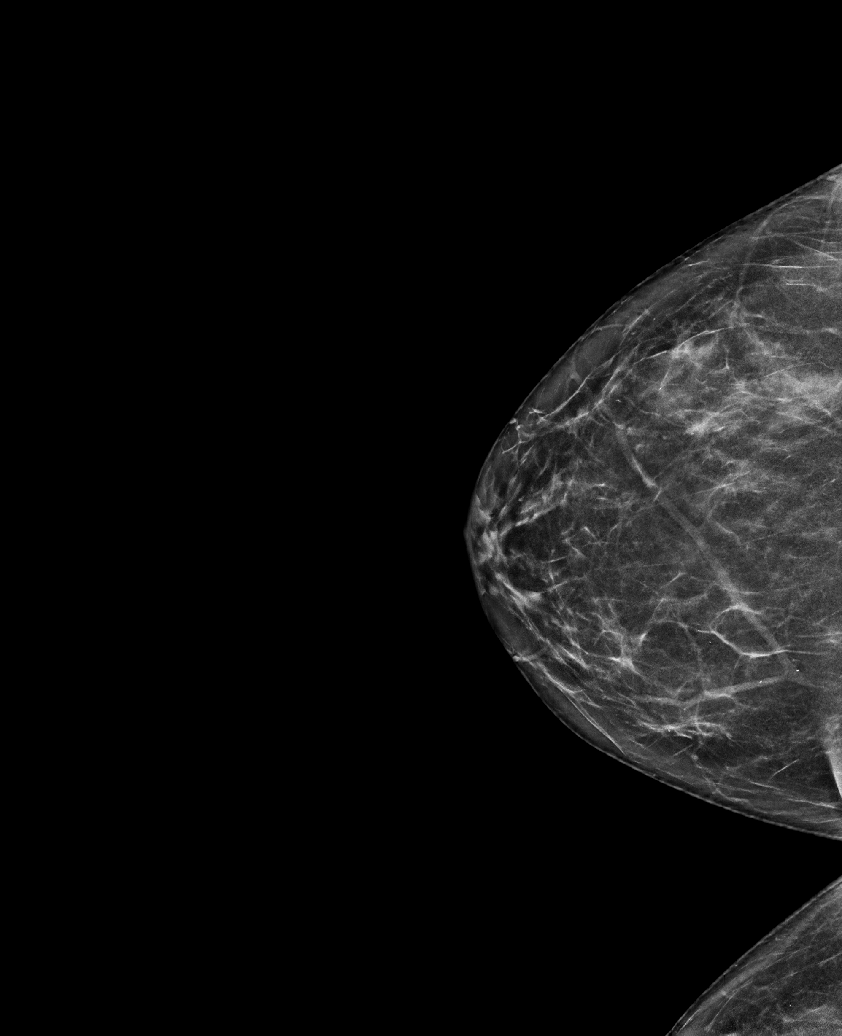

[R MLO tomo · tomo slice 47/93.0]
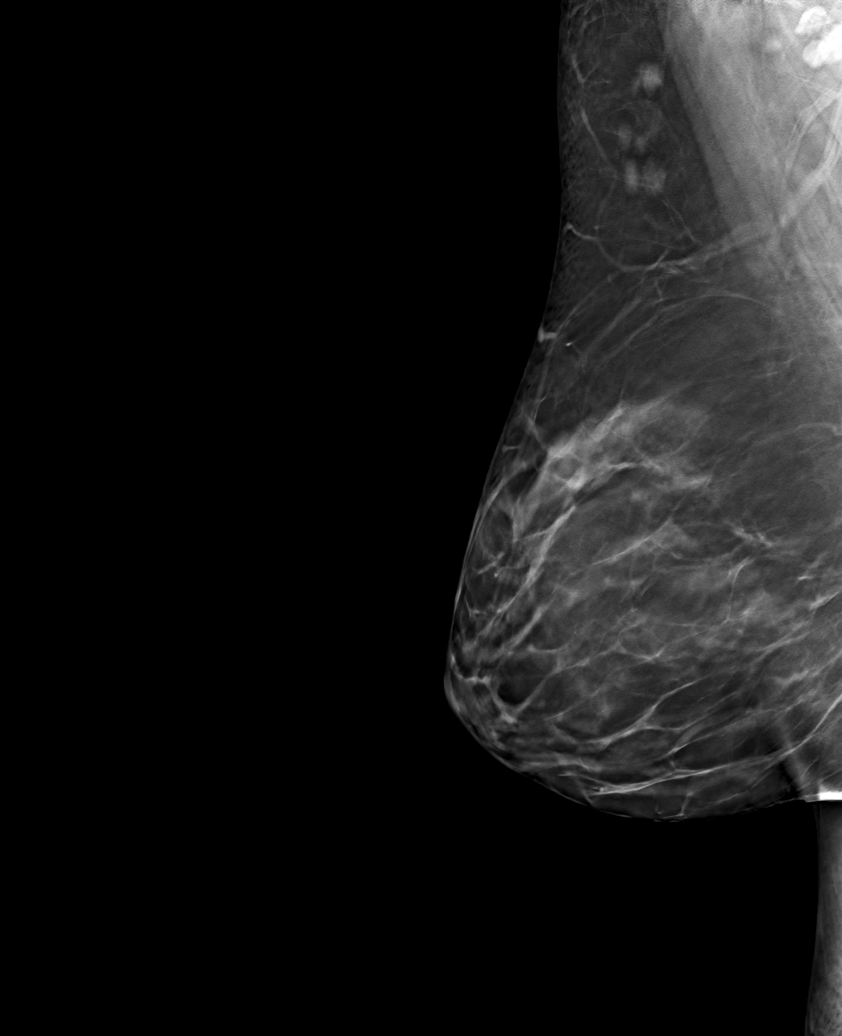

[6 of 30 positions shown; findings below may reference images not displayed]

ACR Breast Density Category b: There are scattered areas of
fibroglandular density.
FINDINGS: There are no findings suspicious for malignancy.
IMPRESSION: No mammographic evidence of malignancy. A result letter of this
screening mammogram will be mailed directly to the patient.

RECOMMENDATION:
Screening mammogram in one year. (Code:XG-X-X7B)

BI-RADS CATEGORY  1: Negative.
